# Patient Record
Sex: Female | Born: 1935
Health system: Southern US, Community
[De-identification: ages and names within clinical notes are randomized; demographics above are authoritative.]

## PROBLEM LIST (undated history)

## (undated) DIAGNOSIS — I10 Essential (primary) hypertension: Secondary | ICD-10-CM

## (undated) DIAGNOSIS — E119 Type 2 diabetes mellitus without complications: Secondary | ICD-10-CM

## (undated) HISTORY — PX: ORIF FOOT FRACTURE: SHX2123

## (undated) HISTORY — PX: CATARACT EXTRACTION: SUR2

## (undated) HISTORY — PX: ABDOMINAL HYSTERECTOMY: SHX81

---

## 1997-08-04 ENCOUNTER — Emergency Department (HOSPITAL_COMMUNITY): Admission: EM | Admit: 1997-08-04 | Discharge: 1997-08-04 | Payer: Self-pay | Admitting: Emergency Medicine

## 1997-08-12 ENCOUNTER — Emergency Department (HOSPITAL_COMMUNITY): Admission: EM | Admit: 1997-08-12 | Discharge: 1997-08-12 | Payer: Self-pay | Admitting: Emergency Medicine

## 1998-08-21 ENCOUNTER — Ambulatory Visit (HOSPITAL_COMMUNITY): Admission: RE | Admit: 1998-08-21 | Discharge: 1998-08-21 | Payer: Self-pay | Admitting: Gastroenterology

## 1999-02-16 ENCOUNTER — Encounter: Payer: Self-pay | Admitting: Internal Medicine

## 1999-02-16 ENCOUNTER — Encounter: Admission: RE | Admit: 1999-02-16 | Discharge: 1999-02-16 | Payer: Self-pay | Admitting: Internal Medicine

## 2000-07-07 ENCOUNTER — Encounter: Admission: RE | Admit: 2000-07-07 | Discharge: 2000-07-07 | Payer: Self-pay | Admitting: Internal Medicine

## 2000-07-07 ENCOUNTER — Encounter: Payer: Self-pay | Admitting: Internal Medicine

## 2000-11-25 ENCOUNTER — Encounter: Admission: RE | Admit: 2000-11-25 | Discharge: 2001-02-23 | Payer: Self-pay | Admitting: Internal Medicine

## 2001-03-09 ENCOUNTER — Emergency Department (HOSPITAL_COMMUNITY): Admission: EM | Admit: 2001-03-09 | Discharge: 2001-03-10 | Payer: Self-pay | Admitting: Emergency Medicine

## 2001-07-16 ENCOUNTER — Encounter: Admission: RE | Admit: 2001-07-16 | Discharge: 2001-07-16 | Payer: Self-pay | Admitting: Internal Medicine

## 2001-07-16 ENCOUNTER — Encounter: Payer: Self-pay | Admitting: Internal Medicine

## 2001-11-27 ENCOUNTER — Encounter: Admission: RE | Admit: 2001-11-27 | Discharge: 2001-11-27 | Payer: Self-pay | Admitting: Internal Medicine

## 2001-11-27 ENCOUNTER — Encounter: Payer: Self-pay | Admitting: Internal Medicine

## 2002-07-20 ENCOUNTER — Encounter: Payer: Self-pay | Admitting: Internal Medicine

## 2002-07-20 ENCOUNTER — Encounter: Admission: RE | Admit: 2002-07-20 | Discharge: 2002-07-20 | Payer: Self-pay | Admitting: Internal Medicine

## 2003-07-25 ENCOUNTER — Encounter: Admission: RE | Admit: 2003-07-25 | Discharge: 2003-07-25 | Payer: Self-pay | Admitting: Internal Medicine

## 2004-01-13 ENCOUNTER — Ambulatory Visit: Admission: RE | Admit: 2004-01-13 | Discharge: 2004-01-13 | Payer: Self-pay | Admitting: Orthopedic Surgery

## 2004-01-13 ENCOUNTER — Ambulatory Visit (HOSPITAL_BASED_OUTPATIENT_CLINIC_OR_DEPARTMENT_OTHER): Admission: RE | Admit: 2004-01-13 | Discharge: 2004-01-13 | Payer: Self-pay | Admitting: Orthopedic Surgery

## 2004-07-30 ENCOUNTER — Encounter: Admission: RE | Admit: 2004-07-30 | Discharge: 2004-07-30 | Payer: Self-pay | Admitting: Family Medicine

## 2004-08-10 ENCOUNTER — Encounter: Admission: RE | Admit: 2004-08-10 | Discharge: 2004-08-10 | Payer: Self-pay | Admitting: Family Medicine

## 2005-08-14 ENCOUNTER — Emergency Department (HOSPITAL_COMMUNITY): Admission: EM | Admit: 2005-08-14 | Discharge: 2005-08-14 | Payer: Self-pay | Admitting: Emergency Medicine

## 2006-01-28 ENCOUNTER — Encounter: Admission: RE | Admit: 2006-01-28 | Discharge: 2006-01-28 | Payer: Self-pay | Admitting: Internal Medicine

## 2006-03-17 ENCOUNTER — Encounter: Admission: RE | Admit: 2006-03-17 | Discharge: 2006-03-17 | Payer: Self-pay | Admitting: Internal Medicine

## 2006-11-13 ENCOUNTER — Ambulatory Visit (HOSPITAL_COMMUNITY): Admission: RE | Admit: 2006-11-13 | Discharge: 2006-11-13 | Payer: Self-pay | Admitting: Ophthalmology

## 2007-01-30 ENCOUNTER — Encounter: Admission: RE | Admit: 2007-01-30 | Discharge: 2007-01-30 | Payer: Self-pay | Admitting: *Deleted

## 2008-02-01 ENCOUNTER — Encounter: Admission: RE | Admit: 2008-02-01 | Discharge: 2008-02-01 | Payer: Self-pay | Admitting: Family Medicine

## 2008-10-09 ENCOUNTER — Emergency Department (HOSPITAL_COMMUNITY): Admission: EM | Admit: 2008-10-09 | Discharge: 2008-10-09 | Payer: Self-pay | Admitting: Emergency Medicine

## 2009-02-01 ENCOUNTER — Encounter: Admission: RE | Admit: 2009-02-01 | Discharge: 2009-02-01 | Payer: Self-pay | Admitting: Family Medicine

## 2010-02-02 ENCOUNTER — Encounter: Admission: RE | Admit: 2010-02-02 | Discharge: 2010-02-02 | Payer: Self-pay | Admitting: *Deleted

## 2010-05-20 ENCOUNTER — Encounter: Payer: Self-pay | Admitting: Family Medicine

## 2010-08-06 LAB — CBC
HCT: 40.2 % (ref 36.0–46.0)
Hemoglobin: 13.2 g/dL (ref 12.0–15.0)
MCV: 89.1 fL (ref 78.0–100.0)
Platelets: 229 10*3/uL (ref 150–400)
RBC: 4.51 MIL/uL (ref 3.87–5.11)
WBC: 5.8 10*3/uL (ref 4.0–10.5)

## 2010-08-06 LAB — POCT I-STAT, CHEM 8
Hemoglobin: 13.9 g/dL (ref 12.0–15.0)
Potassium: 3.6 mEq/L (ref 3.5–5.1)
Sodium: 141 mEq/L (ref 135–145)
TCO2: 24 mmol/L (ref 0–100)

## 2010-08-06 LAB — DIFFERENTIAL
Basophils Absolute: 0.1 10*3/uL (ref 0.0–0.1)
Eosinophils Absolute: 0.1 10*3/uL (ref 0.0–0.7)
Eosinophils Relative: 2 % (ref 0–5)
Monocytes Absolute: 0.4 10*3/uL (ref 0.1–1.0)
Monocytes Relative: 8 % (ref 3–12)

## 2010-08-06 LAB — POCT CARDIAC MARKERS
CKMB, poc: <1 ng/mL — ABNORMAL LOW (ref 1.0–8.0)
Myoglobin, poc: 63.8 ng/mL (ref 12–200)

## 2010-09-14 NOTE — Op Note (Signed)
NAME:  Elizabeth Cantu, Elizabeth Cantu                           ACCOUNT NO.:  [id number]   MEDICAL RECORD NO.:  [id number]                   PATIENT TYPE:  AMB   LOCATION:  [facility]                                  FACILITY:  [facility]   PHYSICIAN:  Alta Corning, M.D.                DATE OF BIRTH:  March 13, 1936   DATE OF PROCEDURE:  DATE OF DISCHARGE:                                 OPERATIVE REPORT   DATE OF SURGERY:  January 13, 2004.   PREOPERATIVE DIAGNOSIS:  Medial side joint pain with failure of conservative  care.   POSTOPERATIVE DIAGNOSES:  1.  Medial meniscal tear.  2.  Chondromalacia, medial femoral condyle.  3.  Chondromalacia of the patella.   PRINCIPAL PROCEDURE:  1.  Partial posterior horn medial meniscectomy with __________ debridement      of medial femoral condyle.  2.  Debridement of patellofemoral area.   SURGEON:  Alta Corning, MD.   ASSISTANT:  Gary Fleet, PA.   ANESTHESIA:  General.   BRIEF HISTORY:  A 75 year old female with a long history of having a medial  side knee pain.  She was treated conservatively initially with medications  and injection therapy, and she did well initially, but then began having  increased complaints of pain.  Because of this, she was evaluated and felt  to need an operative arthroscopy, and she is brought to the operating room  for this procedure.   PROCEDURE:  The patient was brought to the operating room and after adequate  anesthesia was obtained with general anesthetic, the patient was placed on  the operating table, and the left leg was prepped and draped in the usual  sterile fashion.  Following this, attention was turned to the medial aspect  of the left knee, which showed grade III and some grade IV changes on the  medial femoral condyle.  This was debrided back to a smooth and stable rim.  The medial meniscus had a complex tear from the mid-body around to the  posterior horn.  This was probed and the portions of the torn  meniscus,  which were flipped out of position, were debrided with a suction shaver and  the straight biting forceps.  Once the remaining meniscal rim was contoured  down, attention was turned up into the patellofemoral joint where a  patellofemoral chondroplasty was performed.  The medial femoral condyle was  likewise debrided again at this point.  The lateral compartment was within  normal limits.  The anterior cruciate was  within normal limits.  At this point, the knee was copiously irrigated and  suctioned dry.  The arthroscopic portals were closed with a bandage, a  sterile compressive dressing was applied, and the patient taken to the  recovery room where she was noted to be in satisfactory condition.  Estimated blood loss for the procedure was none.      JLG/MEDQ  D:  01/13/2004  T:  01/15/2004  Job:  [id number]

## 2011-01-01 ENCOUNTER — Other Ambulatory Visit: Payer: Self-pay | Admitting: Family Medicine

## 2011-01-01 DIAGNOSIS — Z1231 Encounter for screening mammogram for malignant neoplasm of breast: Secondary | ICD-10-CM

## 2011-02-04 ENCOUNTER — Ambulatory Visit
Admission: RE | Admit: 2011-02-04 | Discharge: 2011-02-04 | Disposition: A | Discharge status: Home / Self Care | Payer: PRIVATE HEALTH INSURANCE | Source: Ambulatory Visit | Attending: Family Medicine | Admitting: Family Medicine

## 2011-02-04 DIAGNOSIS — Z1231 Encounter for screening mammogram for malignant neoplasm of breast: Secondary | ICD-10-CM

## 2012-01-03 ENCOUNTER — Other Ambulatory Visit: Payer: Self-pay | Admitting: Family Medicine

## 2012-01-03 DIAGNOSIS — Z1231 Encounter for screening mammogram for malignant neoplasm of breast: Secondary | ICD-10-CM

## 2012-02-07 ENCOUNTER — Ambulatory Visit
Admission: RE | Admit: 2012-02-07 | Discharge: 2012-02-07 | Disposition: A | Discharge status: Home / Self Care | Payer: Medicare Other | Source: Ambulatory Visit | Attending: Family Medicine | Admitting: Family Medicine

## 2012-02-07 DIAGNOSIS — Z1231 Encounter for screening mammogram for malignant neoplasm of breast: Secondary | ICD-10-CM

## 2012-11-06 ENCOUNTER — Other Ambulatory Visit: Payer: Self-pay

## 2012-11-06 DIAGNOSIS — Z1231 Encounter for screening mammogram for malignant neoplasm of breast: Secondary | ICD-10-CM

## 2012-12-08 ENCOUNTER — Other Ambulatory Visit: Payer: Self-pay | Admitting: Family Medicine

## 2012-12-08 DIAGNOSIS — M949 Disorder of cartilage, unspecified: Secondary | ICD-10-CM

## 2013-02-08 ENCOUNTER — Ambulatory Visit
Admission: RE | Admit: 2013-02-08 | Discharge: 2013-02-08 | Disposition: A | Discharge status: Home / Self Care | Payer: Medicare Other | Source: Ambulatory Visit

## 2013-02-08 ENCOUNTER — Ambulatory Visit
Admission: RE | Admit: 2013-02-08 | Discharge: 2013-02-08 | Disposition: A | Discharge status: Home / Self Care | Payer: Medicare Other | Source: Ambulatory Visit | Attending: Family Medicine | Admitting: Family Medicine

## 2013-02-08 DIAGNOSIS — Z1231 Encounter for screening mammogram for malignant neoplasm of breast: Secondary | ICD-10-CM

## 2013-02-08 DIAGNOSIS — M899 Disorder of bone, unspecified: Secondary | ICD-10-CM

## 2014-01-04 ENCOUNTER — Other Ambulatory Visit: Payer: Self-pay

## 2014-01-04 DIAGNOSIS — Z1231 Encounter for screening mammogram for malignant neoplasm of breast: Secondary | ICD-10-CM

## 2014-01-26 ENCOUNTER — Other Ambulatory Visit (HOSPITAL_COMMUNITY): Payer: Self-pay | Admitting: Gastroenterology

## 2014-01-26 DIAGNOSIS — R6881 Early satiety: Secondary | ICD-10-CM

## 2014-02-09 ENCOUNTER — Ambulatory Visit
Admission: RE | Admit: 2014-02-09 | Discharge: 2014-02-09 | Disposition: A | Discharge status: Home / Self Care | Payer: Medicare Other | Source: Ambulatory Visit

## 2014-02-09 DIAGNOSIS — Z1231 Encounter for screening mammogram for malignant neoplasm of breast: Secondary | ICD-10-CM

## 2014-02-10 ENCOUNTER — Ambulatory Visit (HOSPITAL_COMMUNITY)
Admission: RE | Admit: 2014-02-10 | Discharge: 2014-02-10 | Disposition: A | Discharge status: Home / Self Care | Payer: Medicare Other | Source: Ambulatory Visit | Attending: Gastroenterology | Admitting: Gastroenterology

## 2014-02-10 DIAGNOSIS — R6881 Early satiety: Secondary | ICD-10-CM | POA: Insufficient documentation

## 2014-02-10 DIAGNOSIS — R634 Abnormal weight loss: Secondary | ICD-10-CM | POA: Insufficient documentation

## 2014-02-10 LAB — GLUCOSE, CAPILLARY: GLUCOSE-CAPILLARY: 122 mg/dL — AB (ref 70–99)

## 2014-02-10 MED ORDER — TECHNETIUM TC 99M SULFUR COLLOID
2.2000 | Freq: Once | INTRAVENOUS | Status: AC | PRN
  Administered 2014-02-10: 2.2 via INTRAVENOUS

## 2014-05-07 ENCOUNTER — Encounter (HOSPITAL_COMMUNITY): Payer: Self-pay

## 2014-05-07 ENCOUNTER — Emergency Department (HOSPITAL_COMMUNITY)
Admission: EM | Admit: 2014-05-07 | Discharge: 2014-05-07 | Disposition: A | Discharge status: Home / Self Care | Payer: Medicare Other | Attending: Emergency Medicine | Admitting: Emergency Medicine

## 2014-05-07 ENCOUNTER — Emergency Department (HOSPITAL_COMMUNITY): Payer: Medicare Other

## 2014-05-07 DIAGNOSIS — I10 Essential (primary) hypertension: Secondary | ICD-10-CM | POA: Insufficient documentation

## 2014-05-07 DIAGNOSIS — Y9389 Activity, other specified: Secondary | ICD-10-CM | POA: Diagnosis not present

## 2014-05-07 DIAGNOSIS — Y998 Other external cause status: Secondary | ICD-10-CM | POA: Insufficient documentation

## 2014-05-07 DIAGNOSIS — S60112A Contusion of left thumb with damage to nail, initial encounter: Secondary | ICD-10-CM | POA: Diagnosis not present

## 2014-05-07 DIAGNOSIS — W231XXA Caught, crushed, jammed, or pinched between stationary objects, initial encounter: Secondary | ICD-10-CM | POA: Insufficient documentation

## 2014-05-07 DIAGNOSIS — Z7982 Long term (current) use of aspirin: Secondary | ICD-10-CM | POA: Diagnosis not present

## 2014-05-07 DIAGNOSIS — S60932A Unspecified superficial injury of left thumb, initial encounter: Secondary | ICD-10-CM | POA: Diagnosis present

## 2014-05-07 DIAGNOSIS — S6010XA Contusion of unspecified finger with damage to nail, initial encounter: Secondary | ICD-10-CM

## 2014-05-07 DIAGNOSIS — Y9289 Other specified places as the place of occurrence of the external cause: Secondary | ICD-10-CM | POA: Diagnosis not present

## 2014-05-07 DIAGNOSIS — E119 Type 2 diabetes mellitus without complications: Secondary | ICD-10-CM | POA: Insufficient documentation

## 2014-05-07 DIAGNOSIS — Z79899 Other long term (current) drug therapy: Secondary | ICD-10-CM | POA: Diagnosis not present

## 2014-05-07 HISTORY — DX: Essential (primary) hypertension: I10

## 2014-05-07 HISTORY — DX: Type 2 diabetes mellitus without complications: E11.9

## 2014-05-07 NOTE — ED Notes (Signed)
She states about three hours ago she shut her left thumb in car door.  She has pain in same and a proximal subungual hematoma with pain there.  She is in no distress.

## 2014-05-07 NOTE — ED Provider Notes (Signed)
CSN: [id number]     Arrival date & time 05/07/14  1604 History   First MD Initiated Contact with Patient 05/07/14 1751     Chief Complaint  Patient presents with  . Finger Injury     (Consider location/radiation/quality/duration/timing/severity/associated sxs/prior Treatment) HPI  Patient presents after sustaining an injury to her left thumb. She slammed her thumb in the car door. Since that time there's been pain focally in the area.  Pain is severe, nonradiating.  No loss of function, nor other injuries. She has not taken any medication for pain relief thus far.   Past Medical History  Diagnosis Date  . Diabetes mellitus without complication   . Hypertension    Past Surgical History  Procedure Laterality Date  . Orif foot fracture Right   . Abdominal hysterectomy     No family history on file. History  Substance Use Topics  . Smoking status: Never Smoker   . Smokeless tobacco: Current User  . Alcohol Use: No   OB History    No data available     Review of Systems  Constitutional: Negative for fever.  Musculoskeletal:       HPI  Skin: Positive for wound.  Allergic/Immunologic: Negative for immunocompromised state.  Neurological: Negative for weakness and numbness.      Allergies  Review of patient's allergies indicates no known allergies.  Home Medications   Prior to Admission medications   Medication Sig Start Date End Date Taking? Authorizing Provider  aspirin 81 MG tablet Take 81 mg by mouth daily.   Yes Historical Provider, MD  calcium carbonate (OS-CAL) 600 MG TABS tablet Take 600 mg by mouth daily.   Yes Historical Provider, MD  Cyanocobalamin (VITAMIN B-12 PO) Take 2,500 mcg by mouth daily.   Yes Historical Provider, MD  diltiazem (TIAZAC) 300 MG 24 hr capsule Take 300 mg by mouth daily.   Yes Historical Provider, MD  ferrous sulfate 325 (65 FE) MG tablet Take 325 mg by mouth daily with breakfast.   Yes Historical Provider, MD  hydrochlorothiazide  (HYDRODIURIL) 25 MG tablet Take 25 mg by mouth daily.   Yes Historical Provider, MD  losartan (COZAAR) 50 MG tablet Take 50 mg by mouth daily.   Yes Historical Provider, MD  lovastatin (MEVACOR) 40 MG tablet Take 40 mg by mouth daily.   Yes Historical Provider, MD  metFORMIN (GLUCOPHAGE) 1000 MG tablet Take 1,000 mg by mouth 2 (two) times daily with a meal.   Yes Historical Provider, MD  pantoprazole (PROTONIX) 40 MG tablet Take 40 mg by mouth daily.   Yes Historical Provider, MD  PARoxetine (PAXIL) 10 MG tablet Take 10 mg by mouth daily.   Yes Historical Provider, MD  potassium chloride SA (K-DUR,KLOR-CON) 20 MEQ tablet Take 20 mEq by mouth 2 (two) times daily.   Yes Historical Provider, MD   BP 130/88 mmHg  Pulse 84  Temp(Src) 98.2 F (36.8 C) (Oral)  Resp 16  SpO2 97% Physical Exam  Constitutional: She is oriented to person, place, and time. She appears well-developed and well-nourished. No distress.  HENT:  Head: Normocephalic and atraumatic.  Eyes: Conjunctivae and EOM are normal.  Cardiovascular: Normal rate, regular rhythm and intact distal pulses.   Pulmonary/Chest: Effort normal. No stridor. No respiratory distress.  Musculoskeletal: She exhibits no edema.       Arms: Neurological: She is alert and oriented to person, place, and time. No cranial nerve deficit.  Skin: Skin is warm and dry.  Psychiatric:  She has a normal mood and affect.  Nursing note and vitals reviewed.   ED Course  Drain subungal hematoma Date/Time: 05/07/2014 6:47 PM Performed by: Carmin Muskrat Authorized by: Carmin Muskrat Consent: The procedure was performed in an emergent situation. Verbal consent obtained. Risks and benefits: risks, benefits and alternatives were discussed Consent given by: patient Patient understanding: patient states understanding of the procedure being performed Patient consent: the patient's understanding of the procedure matches consent given Procedure consent: procedure  consent matches procedure scheduled Relevant documents: relevant documents present and verified Test results: test results available and properly labeled Site marked: the operative site was marked Imaging studies: imaging studies available Required items: required blood products, implants, devices, and special equipment available Patient identity confirmed: verbally with patient Preparation: Patient was prepped and draped in the usual sterile fashion. Local anesthesia used: no Patient sedated: no Patient tolerance: Patient tolerated the procedure well with no immediate complications Comments: Following irrigation (H2O) and sterilization of the area, two independent holes were made via high temperature trephination. Blood was produced through each opening. Procedure well tolerated.   (including critical care time) Labs Review Labs Reviewed - No data to display  Imaging Review Dg Finger Thumb Left  05/07/2014   CLINICAL DATA:  Pain, bruising, discoloration, shut finger in door today  EXAM: LEFT THUMB 2+V  COMPARISON:  None.  FINDINGS: There is no evidence of fracture or dislocation. 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 Soft tissues are unremarkable  IMPRESSION: No acute osseous injury of the left thumb.   Electronically Signed   By: Kathreen Devoid   On: 05/07/2014 17:19      MDM   Final diagnoses:  Subungual hematoma of digit of hand, initial encounter    Patient presents after sustaining an injury to her left thumb. Patient has no evidence for fracture, nor neurologic compromise.  The patient had successful trephination of the thumb with production of blood through each opening. Patient felt better, was discharged in stable condition with and follow-up as needed.    Carmin Muskrat, MD 05/07/14 [phone]

## 2014-05-07 NOTE — Discharge Instructions (Signed)
Subungual Hematoma °A subungual hematoma is a pocket of blood that collects under the fingernail or toenail. The pressure created by the blood under the nail can cause pain. °CAUSES  °A subungual hematoma occurs when an injury to the finger or toe causes a blood vessel beneath the nail to break. The injury can occur from a direct blow such as slamming a finger in a door. It can also occur from a repeated injury such as pressure on the foot in a shoe while running. A subungual hematoma is sometimes called runner's toe or tennis toe. °SYMPTOMS  °· Blue or dark blue skin under the nail. °· Pain or throbbing in the injured area. °DIAGNOSIS  °Your caregiver can determine whether you have a subungual hematoma based on your history and a physical exam. If your caregiver thinks you might have a broken (fractured) bone, X-rays may be taken. °TREATMENT  °Hematomas usually go away on their own over time. Your caregiver may make a hole in the nail to drain the blood. Draining the blood is painless and usually provides significant relief from pain and throbbing. The nail usually grows back normally after this procedure. In some cases, the nail may need to be removed. This is done if there is a cut under the nail that requires stitches (sutures). °HOME CARE INSTRUCTIONS  °· Put ice on the injured area. °¨ Put ice in a plastic bag. °¨ Place a towel between your skin and the bag. °¨ Leave the ice on for 15-20 minutes, 03-04 times a day for the first 1 to 2 days. °· Elevate the injured area to help decrease pain and swelling. °· If you were given a bandage, wear it for as long as directed by your caregiver. °· If part of your nail falls off, trim the remaining nail gently. This prevents the nail from catching on something and causing further injury. °· Only take over-the-counter or prescription medicines for pain, discomfort, or fever as directed by your caregiver. °SEEK IMMEDIATE MEDICAL CARE IF:  °· You have redness or swelling  around the nail. °· You have yellowish-white fluid (pus) coming from the nail. °· Your pain is not controlled with medicine. °· You have a fever. °MAKE SURE YOU: °· Understand these instructions. °· Will watch your condition. °· Will get help right away if you are not doing well or get worse. °Document Released: 04/12/2000 Document Revised: 07/08/2011 Document Reviewed: 04/03/2011 °ExitCare® Patient Information ©2015 [employer]. This information is not intended to replace advice given to you by your health care provider. Make sure you discuss any questions you have with your health care provider. ° °

## 2014-06-08 ENCOUNTER — Other Ambulatory Visit: Payer: Self-pay | Admitting: Family Medicine

## 2014-06-08 DIAGNOSIS — E785 Hyperlipidemia, unspecified: Secondary | ICD-10-CM

## 2014-06-08 DIAGNOSIS — R42 Dizziness and giddiness: Secondary | ICD-10-CM

## 2014-06-08 DIAGNOSIS — R200 Anesthesia of skin: Secondary | ICD-10-CM

## 2014-06-15 ENCOUNTER — Ambulatory Visit
Admission: RE | Admit: 2014-06-15 | Discharge: 2014-06-15 | Disposition: A | Discharge status: Home / Self Care | Payer: Medicare Other | Source: Ambulatory Visit | Attending: Family Medicine | Admitting: Family Medicine

## 2014-06-15 DIAGNOSIS — E785 Hyperlipidemia, unspecified: Secondary | ICD-10-CM

## 2014-06-15 DIAGNOSIS — R42 Dizziness and giddiness: Secondary | ICD-10-CM

## 2014-06-15 DIAGNOSIS — R200 Anesthesia of skin: Secondary | ICD-10-CM

## 2015-01-13 ENCOUNTER — Other Ambulatory Visit: Payer: Self-pay

## 2015-01-13 DIAGNOSIS — Z1231 Encounter for screening mammogram for malignant neoplasm of breast: Secondary | ICD-10-CM

## 2015-02-15 ENCOUNTER — Ambulatory Visit
Admission: RE | Admit: 2015-02-15 | Discharge: 2015-02-15 | Disposition: A | Discharge status: Home / Self Care | Payer: Medicare Other | Source: Ambulatory Visit

## 2015-02-15 DIAGNOSIS — Z1231 Encounter for screening mammogram for malignant neoplasm of breast: Secondary | ICD-10-CM

## 2015-08-29 ENCOUNTER — Encounter: Payer: Self-pay | Admitting: Podiatry

## 2015-08-29 ENCOUNTER — Ambulatory Visit (INDEPENDENT_AMBULATORY_CARE_PROVIDER_SITE_OTHER): Payer: Medicare Other | Admitting: Podiatry

## 2015-08-29 DIAGNOSIS — Z794 Long term (current) use of insulin: Secondary | ICD-10-CM

## 2015-08-29 DIAGNOSIS — B351 Tinea unguium: Secondary | ICD-10-CM | POA: Diagnosis not present

## 2015-08-29 DIAGNOSIS — M79676 Pain in unspecified toe(s): Secondary | ICD-10-CM

## 2015-08-29 DIAGNOSIS — E1142 Type 2 diabetes mellitus with diabetic polyneuropathy: Secondary | ICD-10-CM | POA: Diagnosis not present

## 2015-08-29 NOTE — Progress Notes (Signed)
   Subjective:    Patient ID: Elizabeth Cantu, female    DOB: 06-Mar-1936, 80 y.o.   MRN: [id number]  HPI: She presents today with a chief complaint of painful elongated nails and concerns of thickening of the nails as well as dark stripes along the length of the nail.  Review of Systems  All other systems reviewed and are negative.      Objective:   Physical Exam: Vital signs are stable she is alert and oriented 3 pulses are palpable. Neurologic sensorium is intact. Deep tendon reflexes are intact. Muscle strength is normal bilateral. Orthopedic evaluation demonstrates all joints distal to the ankle for range of motion without crepitation. Cutaneous evaluation demonstrates supple well-hydrated cutis. Toenails are thick dystrophic clinically mycotic but also demonstrate melanocytic distribution this is consistent with melanonychia associated with her skin color. These do not demonstrate any type of neoplasm.          Assessment & Plan:  Assessment: Pain secondary to onychomycosis.  Plan: Debridement of toenails 1 through 5 bilateral.

## 2016-01-23 ENCOUNTER — Other Ambulatory Visit: Payer: Self-pay | Admitting: Family Medicine

## 2016-01-23 DIAGNOSIS — Z1231 Encounter for screening mammogram for malignant neoplasm of breast: Secondary | ICD-10-CM

## 2016-01-23 DIAGNOSIS — E2839 Other primary ovarian failure: Secondary | ICD-10-CM

## 2016-02-19 ENCOUNTER — Ambulatory Visit
Admission: RE | Admit: 2016-02-19 | Discharge: 2016-02-19 | Disposition: A | Discharge status: Home / Self Care | Payer: Medicare Other | Source: Ambulatory Visit | Attending: Family Medicine | Admitting: Family Medicine

## 2016-02-19 DIAGNOSIS — E2839 Other primary ovarian failure: Secondary | ICD-10-CM

## 2016-02-19 DIAGNOSIS — Z1231 Encounter for screening mammogram for malignant neoplasm of breast: Secondary | ICD-10-CM

## 2017-02-10 ENCOUNTER — Other Ambulatory Visit: Payer: Self-pay | Admitting: Family Medicine

## 2017-02-10 DIAGNOSIS — Z1231 Encounter for screening mammogram for malignant neoplasm of breast: Secondary | ICD-10-CM

## 2017-02-26 ENCOUNTER — Ambulatory Visit
Admission: RE | Admit: 2017-02-26 | Discharge: 2017-02-26 | Disposition: A | Discharge status: Home / Self Care | Payer: Medicare Other | Source: Ambulatory Visit | Attending: Family Medicine | Admitting: Family Medicine

## 2017-02-26 DIAGNOSIS — Z1231 Encounter for screening mammogram for malignant neoplasm of breast: Secondary | ICD-10-CM

## 2018-01-16 ENCOUNTER — Other Ambulatory Visit: Payer: Self-pay | Admitting: Family Medicine

## 2018-01-16 DIAGNOSIS — Z1231 Encounter for screening mammogram for malignant neoplasm of breast: Secondary | ICD-10-CM

## 2018-03-02 ENCOUNTER — Ambulatory Visit
Admission: RE | Admit: 2018-03-02 | Discharge: 2018-03-02 | Disposition: A | Discharge status: Home / Self Care | Payer: Medicare Other | Source: Ambulatory Visit | Attending: Family Medicine | Admitting: Family Medicine

## 2018-03-02 DIAGNOSIS — Z1231 Encounter for screening mammogram for malignant neoplasm of breast: Secondary | ICD-10-CM

## 2019-02-02 ENCOUNTER — Other Ambulatory Visit: Payer: Self-pay | Admitting: Family Medicine

## 2019-02-02 DIAGNOSIS — Z1231 Encounter for screening mammogram for malignant neoplasm of breast: Secondary | ICD-10-CM

## 2019-03-18 ENCOUNTER — Ambulatory Visit: Payer: Medicare Other

## 2019-04-20 ENCOUNTER — Other Ambulatory Visit: Payer: Self-pay

## 2019-04-20 ENCOUNTER — Ambulatory Visit
Admission: RE | Admit: 2019-04-20 | Discharge: 2019-04-20 | Disposition: A | Discharge status: Home / Self Care | Payer: Medicare Other | Source: Ambulatory Visit | Attending: Family Medicine | Admitting: Family Medicine

## 2019-04-20 DIAGNOSIS — Z1231 Encounter for screening mammogram for malignant neoplasm of breast: Secondary | ICD-10-CM

## 2019-12-14 DIAGNOSIS — I129 Hypertensive chronic kidney disease with stage 1 through stage 4 chronic kidney disease, or unspecified chronic kidney disease: Secondary | ICD-10-CM | POA: Diagnosis not present

## 2019-12-14 DIAGNOSIS — E785 Hyperlipidemia, unspecified: Secondary | ICD-10-CM | POA: Diagnosis not present

## 2019-12-14 DIAGNOSIS — E1142 Type 2 diabetes mellitus with diabetic polyneuropathy: Secondary | ICD-10-CM | POA: Diagnosis not present

## 2019-12-14 DIAGNOSIS — E1121 Type 2 diabetes mellitus with diabetic nephropathy: Secondary | ICD-10-CM | POA: Diagnosis not present

## 2019-12-14 DIAGNOSIS — N183 Chronic kidney disease, stage 3 unspecified: Secondary | ICD-10-CM | POA: Diagnosis not present

## 2019-12-14 DIAGNOSIS — H6123 Impacted cerumen, bilateral: Secondary | ICD-10-CM | POA: Diagnosis not present

## 2019-12-21 ENCOUNTER — Other Ambulatory Visit: Payer: Self-pay | Admitting: Family Medicine

## 2019-12-21 DIAGNOSIS — Z1231 Encounter for screening mammogram for malignant neoplasm of breast: Secondary | ICD-10-CM

## 2019-12-22 ENCOUNTER — Other Ambulatory Visit: Payer: Self-pay | Admitting: Family Medicine

## 2019-12-22 DIAGNOSIS — N6310 Unspecified lump in the right breast, unspecified quadrant: Secondary | ICD-10-CM

## 2020-01-10 ENCOUNTER — Ambulatory Visit
Admission: RE | Admit: 2020-01-10 | Discharge: 2020-01-10 | Disposition: A | Discharge status: Home / Self Care | Payer: Medicare Other | Source: Ambulatory Visit | Attending: Family Medicine | Admitting: Family Medicine

## 2020-01-10 ENCOUNTER — Ambulatory Visit
Admission: RE | Admit: 2020-01-10 | Discharge: 2020-01-10 | Disposition: A | Discharge status: Home / Self Care | Payer: Medicare PPO | Source: Ambulatory Visit | Attending: Family Medicine | Admitting: Family Medicine

## 2020-01-10 ENCOUNTER — Other Ambulatory Visit: Payer: Self-pay

## 2020-01-10 DIAGNOSIS — N6489 Other specified disorders of breast: Secondary | ICD-10-CM | POA: Diagnosis not present

## 2020-01-10 DIAGNOSIS — N6310 Unspecified lump in the right breast, unspecified quadrant: Secondary | ICD-10-CM

## 2020-01-10 DIAGNOSIS — R922 Inconclusive mammogram: Secondary | ICD-10-CM | POA: Diagnosis not present

## 2020-03-04 DIAGNOSIS — Z23 Encounter for immunization: Secondary | ICD-10-CM | POA: Diagnosis not present

## 2020-03-07 ENCOUNTER — Other Ambulatory Visit: Payer: Self-pay

## 2020-03-07 ENCOUNTER — Ambulatory Visit (INDEPENDENT_AMBULATORY_CARE_PROVIDER_SITE_OTHER): Payer: Medicare PPO | Admitting: Ophthalmology

## 2020-03-07 ENCOUNTER — Encounter (INDEPENDENT_AMBULATORY_CARE_PROVIDER_SITE_OTHER): Payer: Self-pay | Admitting: Ophthalmology

## 2020-03-07 DIAGNOSIS — E113292 Type 2 diabetes mellitus with mild nonproliferative diabetic retinopathy without macular edema, left eye: Secondary | ICD-10-CM

## 2020-03-07 DIAGNOSIS — H35371 Puckering of macula, right eye: Secondary | ICD-10-CM | POA: Diagnosis not present

## 2020-03-07 DIAGNOSIS — Z794 Long term (current) use of insulin: Secondary | ICD-10-CM

## 2020-03-07 DIAGNOSIS — E113293 Type 2 diabetes mellitus with mild nonproliferative diabetic retinopathy without macular edema, bilateral: Secondary | ICD-10-CM | POA: Diagnosis not present

## 2020-03-07 DIAGNOSIS — E113291 Type 2 diabetes mellitus with mild nonproliferative diabetic retinopathy without macular edema, right eye: Secondary | ICD-10-CM

## 2020-03-07 DIAGNOSIS — H43813 Vitreous degeneration, bilateral: Secondary | ICD-10-CM

## 2020-03-07 DIAGNOSIS — Z961 Presence of intraocular lens: Secondary | ICD-10-CM

## 2020-03-07 NOTE — Assessment & Plan Note (Signed)
No management required

## 2020-03-07 NOTE — Assessment & Plan Note (Signed)

## 2020-03-07 NOTE — Assessment & Plan Note (Signed)
The nature of mild nonproliferative diabetic retinopathy was discussed with the patient. Emphasis was placed on tight glucose, blood pressure, and serum lipid control. Avoidance of smoking was emphasized. Maintenance of normal body weight was emphasized. Appropriate follow up dilated exam is 1 year. 

## 2020-03-07 NOTE — Progress Notes (Signed)
03/07/2020     CHIEF COMPLAINT Patient presents for Retina Follow Up   HISTORY OF PRESENT ILLNESS: Elizabeth Cantu is a 84 y.o. female who presents to the clinic today for:   HPI    Retina Follow Up    Patient presents with  Diabetic Retinopathy.  In both eyes.  Severity is moderate.  Duration of 1 year.  Since onset it is stable.  I, the attending physician,  performed the HPI with the patient and updated documentation appropriately.          Comments    1 Year NPDR f\u OU. OCT  Pt states OS becomes red and painful occasionally. Pt will use gtts and it gets better. Denies any changes in vision. BGL: 198 A1C: unknown       Last edited by Tilda Franco on 03/07/2020 10:07 AM. (History)      Referring physician: Kelton Pillar, MD [address],  [place] [address]  HISTORICAL INFORMATION:   Selected notes from the [facility]: No current outpatient medications on file. (Ophthalmic Drugs)   No current facility-administered medications for this visit. (Ophthalmic Drugs)   Current Outpatient Medications (Other)  Medication Sig  . aspirin 81 MG tablet Take 81 mg by mouth daily.  . calcium carbonate (OS-CAL) 600 MG TABS tablet Take 600 mg by mouth daily.  . Cyanocobalamin (VITAMIN B-12 PO) Take 2,500 mcg by mouth daily.  Marland Kitchen diltiazem (TIAZAC) 300 MG 24 hr capsule Take 300 mg by mouth daily.  . ferrous sulfate 325 (65 FE) MG tablet Take 325 mg by mouth daily with breakfast.  . hydrochlorothiazide (HYDRODIURIL) 25 MG tablet Take 25 mg by mouth daily.  Marland Kitchen HYDROcodone-acetaminophen (NORCO/VICODIN) 5-325 MG tablet TK 1 T PO BID PRN P  . losartan (COZAAR) 50 MG tablet Take 50 mg by mouth daily.  Marland Kitchen lovastatin (MEVACOR) 40 MG tablet Take 40 mg by mouth daily.  Marland Kitchen LYRICA 50 MG capsule TK 1 C  PO QHS  . metFORMIN (GLUCOPHAGE) 1000 MG tablet Take 1,000 mg by mouth 2 (two) times daily with a meal.  . pantoprazole (PROTONIX)  40 MG tablet Take 40 mg by mouth daily.  Marland Kitchen PARoxetine (PAXIL) 10 MG tablet Take 10 mg by mouth daily.  . potassium chloride SA (K-DUR,KLOR-CON) 20 MEQ tablet Take 20 mEq by mouth 2 (two) times daily.   No current facility-administered medications for this visit. (Other)      REVIEW OF SYSTEMS: ROS    Positive for: Endocrine   Last edited by Tilda Franco on 03/07/2020 10:07 AM. (History)       ALLERGIES Allergies  Allergen Reactions  . Penicillins     PAST MEDICAL HISTORY Past Medical History:  Diagnosis Date  . Diabetes mellitus without complication ([place])   . Hypertension    Past Surgical History:  Procedure Laterality Date  . ABDOMINAL HYSTERECTOMY    . CATARACT EXTRACTION Bilateral   . ORIF FOOT FRACTURE Right     FAMILY HISTORY History reviewed. No pertinent family history.  SOCIAL HISTORY Social History   Tobacco Use  . Smoking status: Never Smoker  . Smokeless tobacco: Current User  Substance Use Topics  . Alcohol use: No  . Drug use: No         OPHTHALMIC EXAM:  Base Eye Exam    Visual Acuity (Snellen - Linear)      Right Left  Dist [employer] 20/40 -2 20/40 +   Dist ph [place] 20/30 20/25 +       Tonometry (Tonopen, 10:15 AM)      Right Left   Pressure 22 24       Tonometry #2 (Tonopen, 10:15 AM)      Right Left   Pressure 19 24       Pupils      Pupils Dark Light Shape React APD   Right PERRL 1 1 Round Minimal None   Left PERRL 1 1 Round Minimal None       Visual Fields (Counting fingers)      Left Right    Full Full       Neuro/Psych    Oriented x3: Yes   Mood/Affect: Normal       Dilation    Both eyes: 1.0% Mydriacyl, 2.5% Phenylephrine @ 10:15 AM        Slit Lamp and Fundus Exam    External Exam      Right Left   External Normal Normal       Slit Lamp Exam      Right Left   Lids/Lashes Normal Normal   Conjunctiva/Sclera White and quiet White and quiet   Cornea Clear Clear   Anterior Chamber Deep and quiet Deep  and quiet   Iris Round and reactive Round and reactive   Lens Centered posterior chamber intraocular lens Centered posterior chamber intraocular lens   Anterior Vitreous Normal Normal       Fundus Exam      Right Left   Posterior Vitreous Posterior vitreous detachment Posterior vitreous detachment   Disc Normal Normal   C/D Ratio 0.0 0.0   Macula Normal Normal   Vessels NPDR- Mild NPDR- Mild   Periphery Normal Normal          IMAGING AND PROCEDURES  Imaging and Procedures for 03/07/20  OCT, Retina - OU - Both Eyes       Right Eye Quality was good. Scan locations included subfoveal. Central Foveal Thickness: 291. Progression has been stable.   Left Eye Quality was good. Scan locations included subfoveal. Central Foveal Thickness: 289. Progression has been stable.   Notes Bilateral PVD                ASSESSMENT/PLAN:  Nonproliferative diabetic retinopathy of both eyes (HCC) The nature of mild nonproliferative diabetic retinopathy was discussed with the patient. Emphasis was placed on tight glucose, blood pressure, and serum lipid control. Avoidance of smoking was emphasized. Maintenance of normal body weight was emphasized. Appropriate follow up dilated exam is 1 year.  Posterior vitreous detachment of both eyes   The nature of posterior vitreous detachment was discussed with the patient as well as its physiology, its age prevalence, and its possible implication regarding retinal breaks and detachment.  An informational brochure was given to the patient.  All the patient's questions were answered.  The patient was asked to return if new or different flashes or floaters develops.   Patient was instructed to contact office immediately if any changes were noticed. I explained to the patient that vitreous inside the eye is similar to jello inside a bowl. As the jello melts it can start to pull away from the bowl, similarly the vitreous throughout our lives can begin to  pull away from the retina. That process is called a posterior vitreous detachment. In some cases, the vitreous can tug hard enough on the retina to form a  retinal tear. I discussed with the patient the signs and symptoms of a retinal detachment.  Do not rub the eye.  Pseudophakia of both eyes No management required      ICD-10-CM   1. Controlled type 2 diabetes mellitus with mild nonproliferative retinopathy of left eye, with long-term current use of insulin, macular edema presence unspecified ([place])  J24.2683    Z79.4   2. Controlled type 2 diabetes mellitus with right eye affected by mild nonproliferative retinopathy without macular edema, without long-term current use of insulin ([place])  E11.3291   3. Right epiretinal membrane  H35.371 OCT, Retina - OU - Both Eyes  4. Posterior vitreous detachment of both eyes  H43.813   5. Nonproliferative diabetic retinopathy of both eyes ([place])  M19.6222   6. Pseudophakia of both eyes  Z96.1     1.  2.  3.  Ophthalmic Meds Ordered this visit:  No orders of the defined types were placed in this encounter.      Return in about 1 year (around 03/07/2021) for DILATE OU, OCT.  There are no Patient Instructions on file for this visit.   Explained the diagnoses, plan, and follow up with the patient and they expressed understanding.  Patient expressed understanding of the importance of proper follow up care.   Clent Demark Eulia Hatcher M.D. Diseases & Surgery of the Retina and Vitreous Retina & Diabetic [place] 03/07/20     Abbreviations: M myopia (nearsighted); A astigmatism; H hyperopia (farsighted); P presbyopia; Mrx spectacle prescription;  CTL contact lenses; OD right eye; OS left eye; OU both eyes  XT exotropia; ET esotropia; PEK punctate epithelial keratitis; PEE punctate epithelial erosions; DES dry eye syndrome; MGD meibomian gland dysfunction; ATs artificial tears; PFAT's preservative free artificial tears; [place] nuclear sclerotic cataract; PSC  posterior subcapsular cataract; ERM epi-retinal membrane; PVD posterior vitreous detachment; RD retinal detachment; DM diabetes mellitus; DR diabetic retinopathy; NPDR non-proliferative diabetic retinopathy; PDR proliferative diabetic retinopathy; CSME clinically significant macular edema; DME diabetic macular edema; dbh dot blot hemorrhages; CWS cotton wool spot; POAG primary open angle glaucoma; C/D cup-to-disc ratio; HVF humphrey visual field; GVF goldmann visual field; OCT optical coherence tomography; IOP intraocular pressure; BRVO Branch retinal vein occlusion; CRVO central retinal vein occlusion; CRAO central retinal artery occlusion; BRAO branch retinal artery occlusion; RT retinal tear; SB scleral buckle; PPV pars plana vitrectomy; VH Vitreous hemorrhage; PRP panretinal laser photocoagulation; IVK intravitreal kenalog; VMT vitreomacular traction; MH Macular hole;  NVD neovascularization of the disc; NVE neovascularization elsewhere; AREDS age related eye disease study; ARMD age related macular degeneration; POAG primary open angle glaucoma; EBMD epithelial/anterior basement membrane dystrophy; ACIOL anterior chamber intraocular lens; IOL intraocular lens; PCIOL posterior chamber intraocular lens; Phaco/IOL phacoemulsification with intraocular lens placement; [place] photorefractive keratectomy; LASIK laser assisted in situ keratomileusis; HTN hypertension; DM diabetes mellitus; COPD chronic obstructive pulmonary disease

## 2020-03-17 ENCOUNTER — Other Ambulatory Visit: Payer: Self-pay | Admitting: Family Medicine

## 2020-03-17 DIAGNOSIS — Z1231 Encounter for screening mammogram for malignant neoplasm of breast: Secondary | ICD-10-CM

## 2020-04-19 DIAGNOSIS — H52203 Unspecified astigmatism, bilateral: Secondary | ICD-10-CM | POA: Diagnosis not present

## 2020-04-19 DIAGNOSIS — Z961 Presence of intraocular lens: Secondary | ICD-10-CM | POA: Diagnosis not present

## 2020-04-19 DIAGNOSIS — E113293 Type 2 diabetes mellitus with mild nonproliferative diabetic retinopathy without macular edema, bilateral: Secondary | ICD-10-CM | POA: Diagnosis not present

## 2020-05-03 ENCOUNTER — Other Ambulatory Visit: Payer: Self-pay

## 2020-05-03 ENCOUNTER — Ambulatory Visit
Admission: RE | Admit: 2020-05-03 | Discharge: 2020-05-03 | Disposition: A | Discharge status: Home / Self Care | Payer: Medicare PPO | Source: Ambulatory Visit | Attending: Family Medicine | Admitting: Family Medicine

## 2020-05-03 DIAGNOSIS — Z1231 Encounter for screening mammogram for malignant neoplasm of breast: Secondary | ICD-10-CM

## 2020-06-16 DIAGNOSIS — E785 Hyperlipidemia, unspecified: Secondary | ICD-10-CM | POA: Diagnosis not present

## 2020-06-16 DIAGNOSIS — E1142 Type 2 diabetes mellitus with diabetic polyneuropathy: Secondary | ICD-10-CM | POA: Diagnosis not present

## 2020-06-16 DIAGNOSIS — E1121 Type 2 diabetes mellitus with diabetic nephropathy: Secondary | ICD-10-CM | POA: Diagnosis not present

## 2020-06-16 DIAGNOSIS — Z0000 Encounter for general adult medical examination without abnormal findings: Secondary | ICD-10-CM | POA: Diagnosis not present

## 2020-06-16 DIAGNOSIS — I129 Hypertensive chronic kidney disease with stage 1 through stage 4 chronic kidney disease, or unspecified chronic kidney disease: Secondary | ICD-10-CM | POA: Diagnosis not present

## 2020-06-16 DIAGNOSIS — M792 Neuralgia and neuritis, unspecified: Secondary | ICD-10-CM | POA: Diagnosis not present

## 2020-06-16 DIAGNOSIS — D649 Anemia, unspecified: Secondary | ICD-10-CM | POA: Diagnosis not present

## 2020-06-16 DIAGNOSIS — Z1389 Encounter for screening for other disorder: Secondary | ICD-10-CM | POA: Diagnosis not present

## 2020-06-16 DIAGNOSIS — N183 Chronic kidney disease, stage 3 unspecified: Secondary | ICD-10-CM | POA: Diagnosis not present

## 2020-06-22 DIAGNOSIS — D649 Anemia, unspecified: Secondary | ICD-10-CM | POA: Diagnosis not present

## 2020-07-14 DIAGNOSIS — D649 Anemia, unspecified: Secondary | ICD-10-CM | POA: Diagnosis not present

## 2020-07-14 DIAGNOSIS — M419 Scoliosis, unspecified: Secondary | ICD-10-CM | POA: Diagnosis not present

## 2020-07-14 DIAGNOSIS — I129 Hypertensive chronic kidney disease with stage 1 through stage 4 chronic kidney disease, or unspecified chronic kidney disease: Secondary | ICD-10-CM | POA: Diagnosis not present

## 2020-07-14 DIAGNOSIS — K219 Gastro-esophageal reflux disease without esophagitis: Secondary | ICD-10-CM | POA: Diagnosis not present

## 2020-09-05 DIAGNOSIS — R519 Headache, unspecified: Secondary | ICD-10-CM | POA: Diagnosis not present

## 2020-09-05 DIAGNOSIS — H0100A Unspecified blepharitis right eye, upper and lower eyelids: Secondary | ICD-10-CM | POA: Diagnosis not present

## 2020-09-05 DIAGNOSIS — H04123 Dry eye syndrome of bilateral lacrimal glands: Secondary | ICD-10-CM | POA: Diagnosis not present

## 2020-09-05 DIAGNOSIS — H0100B Unspecified blepharitis left eye, upper and lower eyelids: Secondary | ICD-10-CM | POA: Diagnosis not present

## 2020-10-13 DIAGNOSIS — M542 Cervicalgia: Secondary | ICD-10-CM | POA: Diagnosis not present

## 2020-10-13 DIAGNOSIS — R519 Headache, unspecified: Secondary | ICD-10-CM | POA: Diagnosis not present

## 2020-12-22 DIAGNOSIS — E785 Hyperlipidemia, unspecified: Secondary | ICD-10-CM | POA: Diagnosis not present

## 2020-12-22 DIAGNOSIS — K219 Gastro-esophageal reflux disease without esophagitis: Secondary | ICD-10-CM | POA: Diagnosis not present

## 2020-12-22 DIAGNOSIS — I129 Hypertensive chronic kidney disease with stage 1 through stage 4 chronic kidney disease, or unspecified chronic kidney disease: Secondary | ICD-10-CM | POA: Diagnosis not present

## 2020-12-22 DIAGNOSIS — D649 Anemia, unspecified: Secondary | ICD-10-CM | POA: Diagnosis not present

## 2020-12-22 DIAGNOSIS — E1121 Type 2 diabetes mellitus with diabetic nephropathy: Secondary | ICD-10-CM | POA: Diagnosis not present

## 2020-12-22 DIAGNOSIS — E1142 Type 2 diabetes mellitus with diabetic polyneuropathy: Secondary | ICD-10-CM | POA: Diagnosis not present

## 2021-01-10 DIAGNOSIS — H608X2 Other otitis externa, left ear: Secondary | ICD-10-CM | POA: Diagnosis not present

## 2021-02-26 DIAGNOSIS — M542 Cervicalgia: Secondary | ICD-10-CM | POA: Diagnosis not present

## 2021-02-26 DIAGNOSIS — R519 Headache, unspecified: Secondary | ICD-10-CM | POA: Diagnosis not present

## 2021-02-26 DIAGNOSIS — M6281 Muscle weakness (generalized): Secondary | ICD-10-CM | POA: Diagnosis not present

## 2021-03-05 DIAGNOSIS — M6281 Muscle weakness (generalized): Secondary | ICD-10-CM | POA: Diagnosis not present

## 2021-03-05 DIAGNOSIS — M542 Cervicalgia: Secondary | ICD-10-CM | POA: Diagnosis not present

## 2021-03-05 DIAGNOSIS — R519 Headache, unspecified: Secondary | ICD-10-CM | POA: Diagnosis not present

## 2021-03-08 ENCOUNTER — Encounter (INDEPENDENT_AMBULATORY_CARE_PROVIDER_SITE_OTHER): Payer: Medicare PPO | Admitting: Ophthalmology

## 2021-03-08 ENCOUNTER — Encounter (INDEPENDENT_AMBULATORY_CARE_PROVIDER_SITE_OTHER): Payer: Self-pay | Admitting: Ophthalmology

## 2021-03-08 ENCOUNTER — Ambulatory Visit (INDEPENDENT_AMBULATORY_CARE_PROVIDER_SITE_OTHER): Payer: Medicare Other | Admitting: Ophthalmology

## 2021-03-08 ENCOUNTER — Other Ambulatory Visit: Payer: Self-pay

## 2021-03-08 DIAGNOSIS — E113292 Type 2 diabetes mellitus with mild nonproliferative diabetic retinopathy without macular edema, left eye: Secondary | ICD-10-CM | POA: Diagnosis not present

## 2021-03-08 DIAGNOSIS — Z794 Long term (current) use of insulin: Secondary | ICD-10-CM | POA: Diagnosis not present

## 2021-03-08 DIAGNOSIS — H43813 Vitreous degeneration, bilateral: Secondary | ICD-10-CM

## 2021-03-08 DIAGNOSIS — E113293 Type 2 diabetes mellitus with mild nonproliferative diabetic retinopathy without macular edema, bilateral: Secondary | ICD-10-CM | POA: Diagnosis not present

## 2021-03-08 NOTE — Assessment & Plan Note (Signed)
The nature of mild nonproliferative diabetic retinopathy was discussed with the patient. Emphasis was placed on tight glucose, blood pressure, and serum lipid control. Avoidance of smoking was emphasized. Maintenance of normal body weight was emphasized. Appropriate follow up dilated exam is 1 year. 

## 2021-03-08 NOTE — Assessment & Plan Note (Signed)
Physiologic OU stable 

## 2021-03-08 NOTE — Progress Notes (Signed)
03/08/2021     CHIEF COMPLAINT Patient presents for  Chief Complaint  Patient presents with   Retina Follow Up      HISTORY OF PRESENT ILLNESS: Elizabeth Cantu is a 85 y.o. female who presents to the clinic today for:   HPI     Retina Follow Up   Patient presents with  Diabetic Retinopathy.  In both eyes.  This started 1 year ago.  Duration of 1 year.  Since onset it is stable.        Comments   1 year f/u OU with OCT      Last edited by Reather Littler, COA on 03/08/2021  3:25 PM.      Referring physician: Kelton Pillar, MD [address],  [place] [address]  HISTORICAL INFORMATION:   Selected notes from the [facility]: No current outpatient medications on file. (Ophthalmic Drugs)   No current facility-administered medications for this visit. (Ophthalmic Drugs)   Current Outpatient Medications (Other)  Medication Sig   aspirin 81 MG tablet Take 81 mg by mouth daily.   calcium carbonate (OS-CAL) 600 MG TABS tablet Take 600 mg by mouth daily.   Cyanocobalamin (VITAMIN B-12 PO) Take 2,500 mcg by mouth daily.   diltiazem (TIAZAC) 300 MG 24 hr capsule Take 300 mg by mouth daily.   ferrous sulfate 325 (65 FE) MG tablet Take 325 mg by mouth daily with breakfast.   hydrochlorothiazide (HYDRODIURIL) 25 MG tablet Take 25 mg by mouth daily.   HYDROcodone-acetaminophen (NORCO/VICODIN) 5-325 MG tablet TK 1 T PO BID PRN P   losartan (COZAAR) 50 MG tablet Take 50 mg by mouth daily.   lovastatin (MEVACOR) 40 MG tablet Take 40 mg by mouth daily.   LYRICA 50 MG capsule TK 1 C  PO QHS   metFORMIN (GLUCOPHAGE) 1000 MG tablet Take 1,000 mg by mouth 2 (two) times daily with a meal.   pantoprazole (PROTONIX) 40 MG tablet Take 40 mg by mouth daily.   PARoxetine (PAXIL) 10 MG tablet Take 10 mg by mouth daily.   potassium chloride SA (K-DUR,KLOR-CON) 20 MEQ tablet Take 20 mEq by mouth 2 (two) times daily.   No current  facility-administered medications for this visit. (Other)      REVIEW OF SYSTEMS:    ALLERGIES Allergies  Allergen Reactions   Penicillins     PAST MEDICAL HISTORY Past Medical History:  Diagnosis Date   Diabetes mellitus without complication ([place])    Hypertension    Past Surgical History:  Procedure Laterality Date   ABDOMINAL HYSTERECTOMY     CATARACT EXTRACTION Bilateral    ORIF FOOT FRACTURE Right     FAMILY HISTORY History reviewed. No pertinent family history.  SOCIAL HISTORY Social History   Tobacco Use   Smoking status: Never   Smokeless tobacco: Current  Substance Use Topics   Alcohol use: No   Drug use: No         OPHTHALMIC EXAM:  Base Eye Exam     Visual Acuity (ETDRS)       Right Left   Dist [place] 20/30 20/30 -1   Dist ph [place] 20/25 +2 20/20 -1         Tonometry (Tonopen, 3:34 PM)       Right Left   Pressure 16 17         Pupils       Pupils  Dark Light Shape React APD   Right PERRL 1.5 1 Round Minimal None   Left PERRL 1.5 1 Round Minimal None         Visual Fields (Counting fingers)       Left Right    Full Full         Extraocular Movement       Right Left    Full, Ortho Full, Ortho         Neuro/Psych     Oriented x3: Yes   Mood/Affect: Normal         Dilation     Both eyes: 1.0% Mydriacyl, 2.5% Phenylephrine @ 3:32 PM           Slit Lamp and Fundus Exam     External Exam       Right Left   External Normal Normal         Slit Lamp Exam       Right Left   Lids/Lashes Normal Normal   Conjunctiva/Sclera White and quiet White and quiet   Cornea Clear Clear   Anterior Chamber Deep and quiet Deep and quiet   Iris Round and reactive Round and reactive   Lens Centered posterior chamber intraocular lens Centered posterior chamber intraocular lens   Anterior Vitreous Normal Normal         Fundus Exam       Right Left   Posterior Vitreous Posterior vitreous detachment Posterior  vitreous detachment   Disc Normal Normal   C/D Ratio 0.0 0.0   Macula Normal Normal   Vessels NPDR- Mild NPDR- Mild   Periphery Normal Normal            IMAGING AND PROCEDURES  Imaging and Procedures for 03/08/21  OCT, Retina - OU - Both Eyes       Right Eye Quality was good. Scan locations included subfoveal. Central Foveal Thickness: 288. Progression has been stable.   Left Eye Quality was good. Scan locations included subfoveal. Central Foveal Thickness: 288. Progression has been stable.   Notes Bilateral PVD, no active maculopathy OU             ASSESSMENT/PLAN:  Mild nonproliferative diabetic retinopathy of both eyes (HCC) The nature of mild nonproliferative diabetic retinopathy was discussed with the patient. Emphasis was placed on tight glucose, blood pressure, and serum lipid control. Avoidance of smoking was emphasized. Maintenance of normal body weight was emphasized. Appropriate follow up dilated exam is 1 year.  Posterior vitreous detachment of both eyes Physiologic OU stable     ICD-10-CM   1. Controlled type 2 diabetes mellitus with mild nonproliferative retinopathy of left eye, with long-term current use of insulin, macular edema presence unspecified (HCC)  E11.3292 OCT, Retina - OU - Both Eyes   Z79.4     2. Mild nonproliferative diabetic retinopathy of both eyes without macular edema associated with type 2 diabetes mellitus ([place])  H54.5625     3. Posterior vitreous detachment of both eyes  H43.813       1.  Bilateral mild NPDR with no signs of progression over the last 1 year.  2.  Bilateral physiologic PVD.  3.  Pseudophakia OU  Ophthalmic Meds Ordered this visit:  No orders of the defined types were placed in this encounter.      Return in about 1 year (around 03/08/2022) for COLOR FP, OCT.  There are no Patient Instructions on file for this visit.   Explained the diagnoses, plan,  and follow up with the patient and they  expressed understanding.  Patient expressed understanding of the importance of proper follow up care.   Clent Demark Aryahi Denzler M.D. Diseases & Surgery of the Retina and Vitreous [facility]ina [facility]abetic [place] 03/08/21     Abbreviations: M myopia (nearsighted); A astigmatism; H hyperopia (farsighted); P presbyopia; Mrx spectacle prescription;  CTL contact lenses; OD right eye; OS left eye; OU both eyes  XT exotropia; ET esotropia; PEK punctate epithelial keratitis; PEE punctate epithelial erosions; DES dry eye syndrome; MGD meibomian gland dysfunction; ATs artificial tears; PFAT's preservative free artificial tears; [place] nuclear sclerotic cataract; PSC posterior subcapsular cataract; ERM epi-retinal membrane; PVD posterior vitreous detachment; RD retinal detachment; DM diabetes mellitus; DR diabetic retinopathy; NPDR non-proliferative diabetic retinopathy; PDR proliferative diabetic retinopathy; CSME clinically significant macular edema; DME diabetic macular edema; dbh dot blot hemorrhages; CWS cotton wool spot; POAG primary open angle glaucoma; C/D cup-to-disc ratio; HVF humphrey visual field; GVF goldmann visual field; OCT optical coherence tomography; IOP intraocular pressure; BRVO Branch retinal vein occlusion; CRVO central retinal vein occlusion; CRAO central retinal artery occlusion; BRAO branch retinal artery occlusion; RT retinal tear; SB scleral buckle; PPV pars plana vitrectomy; VH Vitreous hemorrhage; PRP panretinal laser photocoagulation; IVK intravitreal kenalog; VMT vitreomacular traction; MH Macular hole;  NVD neovascularization of the disc; NVE neovascularization elsewhere; AREDS age related eye disease study; ARMD age related macular degeneration; POAG primary open angle glaucoma; EBMD epithelial/anterior basement membrane dystrophy; ACIOL anterior chamber intraocular lens; IOL intraocular lens; PCIOL posterior chamber intraocular lens; Phaco/IOL phacoemulsification with intraocular lens placement;  [place] photorefractive keratectomy; LASIK laser assisted in situ keratomileusis; HTN hypertension; DM diabetes mellitus; COPD chronic obstructive pulmonary disease

## 2021-04-06 DIAGNOSIS — E1121 Type 2 diabetes mellitus with diabetic nephropathy: Secondary | ICD-10-CM | POA: Diagnosis not present

## 2021-04-10 ENCOUNTER — Other Ambulatory Visit: Payer: Self-pay | Admitting: Family Medicine

## 2021-04-10 DIAGNOSIS — Z1231 Encounter for screening mammogram for malignant neoplasm of breast: Secondary | ICD-10-CM

## 2021-04-25 DIAGNOSIS — E113293 Type 2 diabetes mellitus with mild nonproliferative diabetic retinopathy without macular edema, bilateral: Secondary | ICD-10-CM | POA: Diagnosis not present

## 2021-04-25 DIAGNOSIS — Z961 Presence of intraocular lens: Secondary | ICD-10-CM | POA: Diagnosis not present

## 2021-04-25 DIAGNOSIS — H52203 Unspecified astigmatism, bilateral: Secondary | ICD-10-CM | POA: Diagnosis not present

## 2021-05-08 ENCOUNTER — Ambulatory Visit
Admission: RE | Admit: 2021-05-08 | Discharge: 2021-05-08 | Disposition: A | Discharge status: Home / Self Care | Payer: Medicare Other | Source: Ambulatory Visit | Attending: Family Medicine | Admitting: Family Medicine

## 2021-05-08 DIAGNOSIS — Z1231 Encounter for screening mammogram for malignant neoplasm of breast: Secondary | ICD-10-CM

## 2021-05-11 ENCOUNTER — Ambulatory Visit: Payer: Medicare Other

## 2021-06-22 DIAGNOSIS — E1142 Type 2 diabetes mellitus with diabetic polyneuropathy: Secondary | ICD-10-CM | POA: Diagnosis not present

## 2021-06-22 DIAGNOSIS — Z1389 Encounter for screening for other disorder: Secondary | ICD-10-CM | POA: Diagnosis not present

## 2021-06-22 DIAGNOSIS — I129 Hypertensive chronic kidney disease with stage 1 through stage 4 chronic kidney disease, or unspecified chronic kidney disease: Secondary | ICD-10-CM | POA: Diagnosis not present

## 2021-06-22 DIAGNOSIS — E113293 Type 2 diabetes mellitus with mild nonproliferative diabetic retinopathy without macular edema, bilateral: Secondary | ICD-10-CM | POA: Diagnosis not present

## 2021-06-22 DIAGNOSIS — E785 Hyperlipidemia, unspecified: Secondary | ICD-10-CM | POA: Diagnosis not present

## 2021-06-22 DIAGNOSIS — M792 Neuralgia and neuritis, unspecified: Secondary | ICD-10-CM | POA: Diagnosis not present

## 2021-06-22 DIAGNOSIS — Z0000 Encounter for general adult medical examination without abnormal findings: Secondary | ICD-10-CM | POA: Diagnosis not present

## 2021-06-22 DIAGNOSIS — K219 Gastro-esophageal reflux disease without esophagitis: Secondary | ICD-10-CM | POA: Diagnosis not present

## 2021-06-22 DIAGNOSIS — E1121 Type 2 diabetes mellitus with diabetic nephropathy: Secondary | ICD-10-CM | POA: Diagnosis not present

## 2021-06-22 DIAGNOSIS — D649 Anemia, unspecified: Secondary | ICD-10-CM | POA: Diagnosis not present

## 2021-10-02 ENCOUNTER — Ambulatory Visit: Payer: Medicare Other | Admitting: Podiatry

## 2021-10-10 ENCOUNTER — Encounter: Payer: Self-pay | Admitting: Podiatry

## 2021-10-10 ENCOUNTER — Ambulatory Visit: Payer: Medicare Other | Admitting: Podiatry

## 2021-10-10 DIAGNOSIS — L84 Corns and callosities: Secondary | ICD-10-CM | POA: Diagnosis not present

## 2021-10-10 DIAGNOSIS — E119 Type 2 diabetes mellitus without complications: Secondary | ICD-10-CM

## 2021-10-10 NOTE — Progress Notes (Signed)
  Subjective:  Patient ID: Elizabeth Cantu, female    DOB: March 22, 1936,   MRN: [id number]  Chief Complaint  Patient presents with   Callouses     Corn / callus trim    86 y.o. female presents for concern of callus on both of her pinky toes. Relates they have been present for months. Relates they have been painful. She is a diabetic and her last A1c is unknown. Denies any other pedal complaints. Denies n/v/f/c.   Past Medical History:  Diagnosis Date   Diabetes mellitus without complication ([place])    Hypertension     Objective:  Physical Exam: Vascular: DP/PT pulses 2/4 bilateral. CFT <3 seconds. Normal hair growth on digits. No edema.  Skin. No lacerations or abrasions bilateral feet. Hyperkeratotic lesions noted sub fifth metatarsal bilateral. Cored lesion noted sub third metatarsal on right. Lesion as well to plantar left heel  Musculoskeletal: MMT 5/5 bilateral lower extremities in DF, PF, Inversion and Eversion. Deceased ROM in DF of ankle joint.  Neurological: Sensation intact to light touch.   Assessment:   1. Corns and callosities   2. Type 2 diabetes mellitus without complication, unspecified whether long term insulin use ([name])      Plan:  Patient was evaluated and treated and all questions answered. -Discussed corns and calluses with patient and treatment options.  -Hyperkeratotic tissue was debrided with chisel without incident.  -Applied salycylic acid treatment to area with dressing. Advised to remove bandaging tomorrow.  -Encouraged daily moisturizing -Discussed use of pumice stone -Advised good supportive shoes and inserts -Patient to return to office as needed or sooner if condition worsens.   Lorenda Peck, DPM

## 2021-11-12 DIAGNOSIS — N183 Chronic kidney disease, stage 3 unspecified: Secondary | ICD-10-CM | POA: Diagnosis not present

## 2021-11-12 DIAGNOSIS — E1142 Type 2 diabetes mellitus with diabetic polyneuropathy: Secondary | ICD-10-CM | POA: Diagnosis not present

## 2021-11-12 DIAGNOSIS — G629 Polyneuropathy, unspecified: Secondary | ICD-10-CM | POA: Diagnosis not present

## 2021-11-12 DIAGNOSIS — R0602 Shortness of breath: Secondary | ICD-10-CM | POA: Diagnosis not present

## 2021-12-10 DIAGNOSIS — Z794 Long term (current) use of insulin: Secondary | ICD-10-CM | POA: Diagnosis not present

## 2021-12-10 DIAGNOSIS — E1121 Type 2 diabetes mellitus with diabetic nephropathy: Secondary | ICD-10-CM | POA: Diagnosis not present

## 2021-12-20 DIAGNOSIS — G629 Polyneuropathy, unspecified: Secondary | ICD-10-CM | POA: Diagnosis not present

## 2021-12-20 DIAGNOSIS — I129 Hypertensive chronic kidney disease with stage 1 through stage 4 chronic kidney disease, or unspecified chronic kidney disease: Secondary | ICD-10-CM | POA: Diagnosis not present

## 2021-12-20 DIAGNOSIS — E1142 Type 2 diabetes mellitus with diabetic polyneuropathy: Secondary | ICD-10-CM | POA: Diagnosis not present

## 2021-12-20 DIAGNOSIS — E785 Hyperlipidemia, unspecified: Secondary | ICD-10-CM | POA: Diagnosis not present

## 2021-12-20 DIAGNOSIS — N183 Chronic kidney disease, stage 3 unspecified: Secondary | ICD-10-CM | POA: Diagnosis not present

## 2022-01-09 DIAGNOSIS — E785 Hyperlipidemia, unspecified: Secondary | ICD-10-CM | POA: Diagnosis not present

## 2022-01-09 DIAGNOSIS — D649 Anemia, unspecified: Secondary | ICD-10-CM | POA: Diagnosis not present

## 2022-01-09 DIAGNOSIS — K219 Gastro-esophageal reflux disease without esophagitis: Secondary | ICD-10-CM | POA: Diagnosis not present

## 2022-01-09 DIAGNOSIS — I129 Hypertensive chronic kidney disease with stage 1 through stage 4 chronic kidney disease, or unspecified chronic kidney disease: Secondary | ICD-10-CM | POA: Diagnosis not present

## 2022-01-09 DIAGNOSIS — E1121 Type 2 diabetes mellitus with diabetic nephropathy: Secondary | ICD-10-CM | POA: Diagnosis not present

## 2022-01-22 ENCOUNTER — Encounter: Payer: Self-pay | Admitting: Neurology

## 2022-01-22 ENCOUNTER — Ambulatory Visit: Payer: Medicare Other | Admitting: Neurology

## 2022-01-22 VITALS — BP 158/83 | HR 81 | Ht 64.0 in | Wt 160.0 lb

## 2022-01-22 DIAGNOSIS — Z794 Long term (current) use of insulin: Secondary | ICD-10-CM | POA: Diagnosis not present

## 2022-01-22 DIAGNOSIS — G6289 Other specified polyneuropathies: Secondary | ICD-10-CM

## 2022-01-22 DIAGNOSIS — R208 Other disturbances of skin sensation: Secondary | ICD-10-CM | POA: Diagnosis not present

## 2022-01-22 DIAGNOSIS — E119 Type 2 diabetes mellitus without complications: Secondary | ICD-10-CM

## 2022-01-22 DIAGNOSIS — G8929 Other chronic pain: Secondary | ICD-10-CM | POA: Diagnosis not present

## 2022-01-22 DIAGNOSIS — M545 Low back pain, unspecified: Secondary | ICD-10-CM | POA: Insufficient documentation

## 2022-01-22 MED ORDER — IMIPRAMINE HCL 10 MG PO TABS: 10.0000 mg | ORAL_TABLET | Freq: Every day | ORAL | 5 refills | Status: DC

## 2022-01-22 MED ORDER — IMIPRAMINE HCL 10 MG PO TABS: ORAL_TABLET | ORAL | 5 refills | Status: DC

## 2022-01-22 NOTE — Progress Notes (Signed)
[facility]  PATIENT: Elizabeth Cantu DOB: 07-Nov-1935  REFERRING DOCTOR OR PCP: Early Osmond, MD SOURCE: Patient, notes from primary care, lab results  _________________________________   HISTORICAL  CHIEF COMPLAINT:  Chief Complaint  Patient presents with   New Patient (Initial Visit)    Rm 2,  pt states for 2-3 years she has been having neuropathy in hands and feet. She has been using medication Gabapentin along with a cream that helps some. (She has also tried lyrica    HISTORY OF PRESENT ILLNESS:  I had the pleasure of seeing your patient, Elizabeth Cantu, at [facility] for neurologic consultation regarding her dysesthetic pain.  She is an 86 year old woman who has had numbness and painful tingling in her feet since 2020 and in her hands f since 2022    pain is burning, stinging, and itching.    She uses an OTC cream with soe benefit.     She is also on gabapentin 300 mg po bid.    She has foot > hand numbness but is not sure if there is weakness.  She has had Type 2 IDDM since 1976.  The insulin was just recently started.    Besides neuropathy, she also has CRF.   She also has hypertension.     Her sleep is variable.  The cream helps her sleep.     She has nocturia just 1-2 a night.     She denies any autoimmune issues.   She denies dry eyes or mouth  Laboratory 11/14/2021: Creatinine is elevated at 1.74 and the eGFR is 29.  Electrolytes are normal.  Hemoglobin A1c is 8.5.  CBC is unremarkable.  B12 is normal.  REVIEW OF SYSTEMS: Constitutional: No fevers, chills, sweats, or change in appetite Eyes: No visual changes, double vision, eye pain Ear, nose and throat: No hearing loss, ear pain, nasal congestion, sore throat Cardiovascular: No chest pain, palpitations Respiratory:  No shortness of breath at rest or with exertion.   No wheezes GastrointestinaI: No nausea, vomiting, diarrhea, abdominal pain, fecal incontinence Genitourinary:   No dysuria, urinary retention or frequency.  No nocturia. Musculoskeletal:  No neck pain, back pain Integumentary: No rash, pruritus, skin lesions Neurological: as above Psychiatric: No depression at this time.  No anxiety Endocrine: No palpitations, diaphoresis, change in appetite, change in weigh or increased thirst Hematologic/Lymphatic:  No anemia, purpura, petechiae. Allergic/Immunologic: No itchy/runny eyes, nasal congestion, recent allergic reactions, rashes  ALLERGIES: Allergies  Allergen Reactions   Penicillins     HOME MEDICATIONS:  Current Outpatient Medications:    acetaminophen (TYLENOL) 500 MG tablet, Take 500 mg by mouth every 6 (six) hours as needed., Disp: , Rfl:    aspirin 81 MG tablet, Take 81 mg by mouth daily., Disp: , Rfl:    atorvastatin (LIPITOR) 20 MG tablet, Take 20 mg by mouth daily., Disp: , Rfl:    calcium carbonate (OS-CAL) 600 MG TABS tablet, Take 600 mg by mouth daily., Disp: , Rfl:    Cyanocobalamin (VITAMIN B-12 PO), Take 2,500 mcg by mouth daily., Disp: , Rfl:    diltiazem (CARDIZEM CD) 300 MG 24 hr capsule, Take 300 mg by mouth daily., Disp: , Rfl:    ferrous sulfate 325 (65 FE) MG tablet, Take 325 mg by mouth daily with breakfast., Disp: , Rfl:    gabapentin (NEURONTIN) 300 MG capsule, Take 300 mg by mouth 2 (two) times daily., Disp: , Rfl:    LANTUS SOLOSTAR 100  UNIT/ML Solostar Pen, Inject 24 Units into the skin at bedtime., Disp: , Rfl:    losartan (COZAAR) 100 MG tablet, Take 100 mg by mouth daily., Disp: , Rfl:    Menthol-Methyl Salicylate (PAIN RELIEVING) CREA, Apply topically., Disp: , Rfl:    pantoprazole (PROTONIX) 40 MG tablet, Take 40 mg by mouth daily., Disp: , Rfl:    Polyethyl Glycol-Propyl Glycol (SYSTANE FREE OP), Apply 1 drop to eye in the morning and at bedtime., Disp: , Rfl:    TRADJENTA 5 MG TABS tablet, Take 5 mg by mouth every morning., Disp: , Rfl:    imipramine (TOFRANIL) 10 MG tablet, Take one or two at bedtime, Disp: 60  tablet, Rfl: 5  PAST MEDICAL HISTORY: Past Medical History:  Diagnosis Date   Diabetes mellitus without complication ([place])    Hypertension     PAST SURGICAL HISTORY: Past Surgical History:  Procedure Laterality Date   ABDOMINAL HYSTERECTOMY     CATARACT EXTRACTION Bilateral    ORIF FOOT FRACTURE Right     FAMILY HISTORY: Family History  Problem Relation Age of Onset   Diabetes Mother     SOCIAL HISTORY: Social History   Socioeconomic History   Marital status: Widowed    Spouse name: Not on file   Number of children: Not on file   Years of education: Not on file   Highest education level: Not on file  Occupational History   Not on file  Tobacco Use   Smoking status: Never   Smokeless tobacco: Current  Substance and Sexual Activity   Alcohol use: No   Drug use: No   Sexual activity: Not on file  Other Topics Concern   Not on file  Social History Narrative   Not on file   Social Determinants of Health   Financial Resource Strain: Not on file  Food Insecurity: Not on file  Transportation Needs: Not on file  Physical Activity: Not on file  Stress: Not on file  Social Connections: Not on file  Intimate Partner Violence: Not on file       PHYSICAL EXAM  Vitals:   01/22/22 1353  BP: (!) 158/83  Pulse: 81  Weight: 160 lb (72.6 kg)  Height: 5' 4"  (1.626 m)    Body mass index is 27.46 kg/m.   General: The patient is well-developed and well-nourished and in no acute distress  HEENT:  Head is [place]/AT.  Sclera are anicteric.   Neck: No carotid bruits are noted.  The neck is nontender.  Cardiovascular: The heart has a regular rate and rhythm with a normal S1 and S2. There were no murmurs, gallops or rubs.    Skin: Extremities are without rash or  edema.  Musculoskeletal:  Back is nontender  Neurologic Exam  Mental status: The patient is alert and oriented x 3 at the time of the examination. The patient has apparent normal recent and remote  memory, with an apparently normal attention span and concentration ability.   Speech is normal.  Cranial nerves: Extraocular movements are full. Pupils are equal, round, and reactive to light and accomodation.  Facial strength and sensation was normal.  Trapezius and sternocleidomastoid strength is normal. No dysarthria is noted.  The tongue is midline, and the patient has symmetric elevation of the soft palate. No obvious hearing deficits are noted.  Motor:  Muscle bulk is normal.   Tone is normal. Strength is  4+/5 in the APB muscles and 5 / 5 in oter hand musces.  No Tinel's or Phalen's signs   Sensory: Sensory testing is intact to pinprick, soft touch and vibration sensation in the arms and proximal legs.  She has reduced pinprick and temperature sensation in the feet relative to above the ankles.  Vibration sensation is fairly intact.  Coordination: Cerebellar testing reveals good finger-nose-finger and heel-to-shin bilaterally.  Gait and station: Station is normal.   Gait is normal for age. Tandem gait is mildly wide but likely normal for age. Romberg is negative.   Reflexes: Deep tendon reflexes are symmetric and normal bilaterally.   Plantar responses are flexor.    DIAGNOSTIC DATA (LABS, IMAGING, TESTING) - I reviewed patient records, labs, notes, testing and imaging myself where available.  Lab Results  Component Value Date   WBC 5.8 10/09/2008   HGB 13.9 10/09/2008   HCT 41.0 10/09/2008   MCV 89.1 10/09/2008   PLT 229 10/09/2008      Component Value Date/Time   NA 141 10/09/2008 1245   K 3.6 10/09/2008 1245   CL 106 10/09/2008 1245   GLUCOSE 129 (H) 10/09/2008 1245   BUN 12 10/09/2008 1245   CREATININE 0.8 10/09/2008 1245       ASSESSMENT AND PLAN  Small fiber polyneuropathy - Plan: Sjogren's syndrome antibods(ssa + ssb), Sedimentation rate, C-reactive protein, Multiple Myeloma Panel (SPEP&IFE w/QIG)  Dysesthesia  Insulin dependent type 2 diabetes  mellitus (HCC)  Chronic right-sided low back pain, unspecified whether sciatica present  In summary, Elizabeth Cantu is an 86 year old woman with painful dysesthetia in her feet and hands most consistent with a small fiber polyneuropathy.  She has diabetes that has not always been well controlled and that is most likely the etiology for her small fiber polyneuropathy.  However, I will check some lab work to make sure that there is not an autoimmune disturbance playing a role.  Additionally, she has back pain and pain down the leg.  She walks bent over.  She could have spinal stenosis that could also be playing a role in her symptoms.  To help with the dysesthetic pain, I will have her start imipramine 10 mg and work up to 20 mg nightly.  If she does not have good benefit, we will consider checking an MRI and also consider EMG.  Thank you for asking me to see Elizabeth Cantu.  Please let me know if I can be of further assistance with her or other patients in the future     Nashon Erbes A. Felecia Shelling, MD, [facility] 3/57/0177, 9:39 PM Certified in Neurology, Clinical Neurophysiology, Sleep Medicine and Neuroimaging  [facility] [address], [place], [place] [address] [phone]

## 2022-01-26 LAB — MULTIPLE MYELOMA PANEL, SERUM
Albumin SerPl Elph-Mcnc: 3.9 g/dL (ref 2.9–4.4)
Albumin/Glob SerPl: 1.2 (ref 0.7–1.7)
Alpha 1: 0.2 g/dL (ref 0.0–0.4)
Alpha2 Glob SerPl Elph-Mcnc: 0.9 g/dL (ref 0.4–1.0)
B-Globulin SerPl Elph-Mcnc: 1 g/dL (ref 0.7–1.3)
Gamma Glob SerPl Elph-Mcnc: 1.4 g/dL (ref 0.4–1.8)
Globulin, Total: 3.4 g/dL (ref 2.2–3.9)
IgA/Immunoglobulin A, Serum: 307 mg/dL (ref 64–422)
IgG (Immunoglobin G), Serum: 1551 mg/dL (ref 586–1602)
IgM (Immunoglobulin M), Srm: 32 mg/dL (ref 26–217)
Total Protein: 7.3 g/dL (ref 6.0–8.5)

## 2022-01-26 LAB — SJOGREN'S SYNDROME ANTIBODS(SSA + SSB)
ENA SSA (RO) Ab: <0.2 AI (ref 0.0–0.9)
ENA SSB (LA) Ab: <0.2 AI (ref 0.0–0.9)

## 2022-01-26 LAB — SEDIMENTATION RATE: Sed Rate: 26 mm/hr (ref 0–40)

## 2022-01-26 LAB — C-REACTIVE PROTEIN: CRP: 3 mg/L (ref 0–10)

## 2022-01-29 ENCOUNTER — Telehealth: Payer: Self-pay

## 2022-01-29 NOTE — Telephone Encounter (Signed)
-----   Message from [place], [place] sent at 01/29/2022 12:59 PM EDT ----- Regarding: Please advise Called pt and informed her that her labs are normal. ----- Message ----- From: Darleen Crocker, RN Sent: 01/29/2022   7:41 AM EDT To: Beckie Salts, RMA   ----- Message ----- From: Britt Bottom, MD Sent: 01/27/2022  12:56 PM EDT To: G[facility] 1 Results  Please let the patient know that the lab work is fine.

## 2022-01-29 NOTE — Telephone Encounter (Signed)
Called pt and informed her that her labs are normal. Pt verbalized understanding. Pt had no questions at this time but was encouraged to call back if questions arise.

## 2022-02-14 DIAGNOSIS — E1121 Type 2 diabetes mellitus with diabetic nephropathy: Secondary | ICD-10-CM | POA: Diagnosis not present

## 2022-02-14 DIAGNOSIS — Z23 Encounter for immunization: Secondary | ICD-10-CM | POA: Diagnosis not present

## 2022-02-20 DIAGNOSIS — K219 Gastro-esophageal reflux disease without esophagitis: Secondary | ICD-10-CM | POA: Diagnosis not present

## 2022-02-20 DIAGNOSIS — E1121 Type 2 diabetes mellitus with diabetic nephropathy: Secondary | ICD-10-CM | POA: Diagnosis not present

## 2022-02-20 DIAGNOSIS — E785 Hyperlipidemia, unspecified: Secondary | ICD-10-CM | POA: Diagnosis not present

## 2022-02-20 DIAGNOSIS — I129 Hypertensive chronic kidney disease with stage 1 through stage 4 chronic kidney disease, or unspecified chronic kidney disease: Secondary | ICD-10-CM | POA: Diagnosis not present

## 2022-02-25 DIAGNOSIS — D631 Anemia in chronic kidney disease: Secondary | ICD-10-CM | POA: Diagnosis not present

## 2022-02-25 DIAGNOSIS — E1122 Type 2 diabetes mellitus with diabetic chronic kidney disease: Secondary | ICD-10-CM | POA: Diagnosis not present

## 2022-02-25 DIAGNOSIS — N189 Chronic kidney disease, unspecified: Secondary | ICD-10-CM | POA: Diagnosis not present

## 2022-02-25 DIAGNOSIS — N390 Urinary tract infection, site not specified: Secondary | ICD-10-CM | POA: Diagnosis not present

## 2022-02-25 DIAGNOSIS — I129 Hypertensive chronic kidney disease with stage 1 through stage 4 chronic kidney disease, or unspecified chronic kidney disease: Secondary | ICD-10-CM | POA: Diagnosis not present

## 2022-02-25 DIAGNOSIS — N2581 Secondary hyperparathyroidism of renal origin: Secondary | ICD-10-CM | POA: Diagnosis not present

## 2022-02-25 DIAGNOSIS — G6289 Other specified polyneuropathies: Secondary | ICD-10-CM | POA: Diagnosis not present

## 2022-02-25 DIAGNOSIS — N184 Chronic kidney disease, stage 4 (severe): Secondary | ICD-10-CM | POA: Diagnosis not present

## 2022-02-27 ENCOUNTER — Other Ambulatory Visit: Payer: Self-pay | Admitting: Nephrology

## 2022-02-27 DIAGNOSIS — N184 Chronic kidney disease, stage 4 (severe): Secondary | ICD-10-CM

## 2022-03-14 ENCOUNTER — Encounter (INDEPENDENT_AMBULATORY_CARE_PROVIDER_SITE_OTHER): Payer: Medicare Other | Admitting: Ophthalmology

## 2022-03-14 DIAGNOSIS — Z794 Long term (current) use of insulin: Secondary | ICD-10-CM | POA: Diagnosis not present

## 2022-03-14 DIAGNOSIS — H43813 Vitreous degeneration, bilateral: Secondary | ICD-10-CM | POA: Diagnosis not present

## 2022-03-14 DIAGNOSIS — E113293 Type 2 diabetes mellitus with mild nonproliferative diabetic retinopathy without macular edema, bilateral: Secondary | ICD-10-CM | POA: Diagnosis not present

## 2022-03-26 ENCOUNTER — Telehealth: Payer: Self-pay | Admitting: Neurology

## 2022-03-26 DIAGNOSIS — R269 Unspecified abnormalities of gait and mobility: Secondary | ICD-10-CM

## 2022-03-26 DIAGNOSIS — M5430 Sciatica, unspecified side: Secondary | ICD-10-CM

## 2022-03-26 DIAGNOSIS — R208 Other disturbances of skin sensation: Secondary | ICD-10-CM

## 2022-03-26 NOTE — Telephone Encounter (Signed)
[facility] Georgina Snell) Pt having increased sciatica pain. Having difficulty walking due to the pain. She has spoke with [place], Health Management Team, However Dr.Sater manages her Gabapentin. Contacting Dr. Felecia Shelling for any recommendations as far as pain management. Can reach out to the patient if need for more information or can call  us back.

## 2022-03-27 ENCOUNTER — Telehealth: Payer: Self-pay | Admitting: Neurology

## 2022-03-27 MED ORDER — GABAPENTIN 300 MG PO CAPS: 300.0000 mg | ORAL_CAPSULE | Freq: Three times a day (TID) | ORAL | 5 refills | Status: AC

## 2022-03-27 NOTE — Telephone Encounter (Signed)
Called pt back. Relayed Dr. Garth Bigness recommendation. She is agreeable to plan. E-scribed rx gabapentin to [facility].   Pt ok with going to [facility] imaging for MRI lumbar. Aware we will place order. Once approved via insurance, she will be contacted to schedule.

## 2022-03-27 NOTE — Telephone Encounter (Signed)
LVM for pt to call office

## 2022-03-27 NOTE — Telephone Encounter (Signed)
[employer] NPR sent to GI [phone] 

## 2022-03-27 NOTE — Addendum Note (Signed)
Addended by: Wyvonnia Lora on: 03/27/2022 11:58 AM   Modules accepted: Orders

## 2022-03-27 NOTE — Addendum Note (Signed)
Addended by: Wyvonnia Lora on: 03/27/2022 11:46 AM   Modules accepted: Orders

## 2022-03-27 NOTE — Telephone Encounter (Signed)
Pt called back. Requesting a call from the nurse.

## 2022-03-28 DIAGNOSIS — I129 Hypertensive chronic kidney disease with stage 1 through stage 4 chronic kidney disease, or unspecified chronic kidney disease: Secondary | ICD-10-CM | POA: Diagnosis not present

## 2022-03-28 DIAGNOSIS — E785 Hyperlipidemia, unspecified: Secondary | ICD-10-CM | POA: Diagnosis not present

## 2022-03-28 DIAGNOSIS — E1121 Type 2 diabetes mellitus with diabetic nephropathy: Secondary | ICD-10-CM | POA: Diagnosis not present

## 2022-03-28 DIAGNOSIS — N183 Chronic kidney disease, stage 3 unspecified: Secondary | ICD-10-CM | POA: Diagnosis not present

## 2022-04-09 DIAGNOSIS — K219 Gastro-esophageal reflux disease without esophagitis: Secondary | ICD-10-CM | POA: Diagnosis not present

## 2022-04-09 DIAGNOSIS — E1121 Type 2 diabetes mellitus with diabetic nephropathy: Secondary | ICD-10-CM | POA: Diagnosis not present

## 2022-04-09 DIAGNOSIS — N183 Chronic kidney disease, stage 3 unspecified: Secondary | ICD-10-CM | POA: Diagnosis not present

## 2022-04-09 DIAGNOSIS — E785 Hyperlipidemia, unspecified: Secondary | ICD-10-CM | POA: Diagnosis not present

## 2022-04-09 DIAGNOSIS — E1142 Type 2 diabetes mellitus with diabetic polyneuropathy: Secondary | ICD-10-CM | POA: Diagnosis not present

## 2022-04-10 ENCOUNTER — Encounter: Payer: Self-pay | Admitting: Neurology

## 2022-04-11 ENCOUNTER — Ambulatory Visit
Admission: RE | Admit: 2022-04-11 | Discharge: 2022-04-11 | Disposition: A | Discharge status: Home / Self Care | Payer: Medicare Other | Source: Ambulatory Visit | Attending: Neurology | Admitting: Neurology

## 2022-04-11 DIAGNOSIS — R269 Unspecified abnormalities of gait and mobility: Secondary | ICD-10-CM | POA: Diagnosis not present

## 2022-04-11 DIAGNOSIS — R208 Other disturbances of skin sensation: Secondary | ICD-10-CM

## 2022-04-11 DIAGNOSIS — M5430 Sciatica, unspecified side: Secondary | ICD-10-CM | POA: Diagnosis not present

## 2022-04-11 DIAGNOSIS — M4316 Spondylolisthesis, lumbar region: Secondary | ICD-10-CM | POA: Diagnosis not present

## 2022-04-11 DIAGNOSIS — M545 Low back pain, unspecified: Secondary | ICD-10-CM | POA: Diagnosis not present

## 2022-04-11 DIAGNOSIS — M48061 Spinal stenosis, lumbar region without neurogenic claudication: Secondary | ICD-10-CM | POA: Diagnosis not present

## 2022-04-11 DIAGNOSIS — M47816 Spondylosis without myelopathy or radiculopathy, lumbar region: Secondary | ICD-10-CM | POA: Diagnosis not present

## 2022-04-12 ENCOUNTER — Telehealth: Payer: Self-pay | Admitting: Neurology

## 2022-04-12 DIAGNOSIS — M48061 Spinal stenosis, lumbar region without neurogenic claudication: Secondary | ICD-10-CM

## 2022-04-12 DIAGNOSIS — M5431 Sciatica, right side: Secondary | ICD-10-CM

## 2022-04-12 NOTE — Telephone Encounter (Signed)
The MRI of the lumbar spine shows multilevel spinal stenosis that is severe at L3-L4 and L4-L5.  There are various degrees of foraminal narrowing with potential for right L3, bilateral L5 and left S1 nerve root compression.  I discussed the finding with her and options and she would prefer to try an epidural before seeing a surgeon.  Therefore I will order this.

## 2022-05-08 ENCOUNTER — Inpatient Hospital Stay
Admission: RE | Admit: 2022-05-08 | Discharge: 2022-05-08 | Disposition: A | Discharge status: Home / Self Care | Payer: Medicare Other | Source: Ambulatory Visit | Attending: Neurology | Admitting: Neurology

## 2022-05-08 NOTE — Discharge Instructions (Signed)

## 2022-05-10 ENCOUNTER — Ambulatory Visit
Admission: RE | Admit: 2022-05-10 | Discharge: 2022-05-10 | Disposition: A | Discharge status: Home / Self Care | Payer: Medicare HMO | Source: Ambulatory Visit | Attending: Neurology | Admitting: Neurology

## 2022-05-10 DIAGNOSIS — M48061 Spinal stenosis, lumbar region without neurogenic claudication: Secondary | ICD-10-CM

## 2022-05-10 DIAGNOSIS — M47816 Spondylosis without myelopathy or radiculopathy, lumbar region: Secondary | ICD-10-CM | POA: Diagnosis not present

## 2022-05-10 DIAGNOSIS — M5431 Sciatica, right side: Secondary | ICD-10-CM

## 2022-05-10 MED ORDER — METHYLPREDNISOLONE ACETATE 40 MG/ML INJ SUSP (RADIOLOG
80.0000 mg | Freq: Once | INTRAMUSCULAR | Status: AC
  Administered 2022-05-10: 80 mg via EPIDURAL

## 2022-05-10 MED ORDER — IOPAMIDOL (ISOVUE-M 200) INJECTION 41%
1.0000 mL | Freq: Once | INTRAMUSCULAR | Status: AC
  Administered 2022-05-10: 1 mL via EPIDURAL

## 2022-05-10 NOTE — Discharge Instructions (Addendum)

## 2022-05-20 DIAGNOSIS — E1121 Type 2 diabetes mellitus with diabetic nephropathy: Secondary | ICD-10-CM | POA: Diagnosis not present

## 2022-05-24 DIAGNOSIS — E1142 Type 2 diabetes mellitus with diabetic polyneuropathy: Secondary | ICD-10-CM | POA: Diagnosis not present

## 2022-05-24 DIAGNOSIS — E785 Hyperlipidemia, unspecified: Secondary | ICD-10-CM | POA: Diagnosis not present

## 2022-05-24 DIAGNOSIS — K219 Gastro-esophageal reflux disease without esophagitis: Secondary | ICD-10-CM | POA: Diagnosis not present

## 2022-05-24 DIAGNOSIS — I129 Hypertensive chronic kidney disease with stage 1 through stage 4 chronic kidney disease, or unspecified chronic kidney disease: Secondary | ICD-10-CM | POA: Diagnosis not present

## 2022-05-24 DIAGNOSIS — D649 Anemia, unspecified: Secondary | ICD-10-CM | POA: Diagnosis not present

## 2022-05-24 DIAGNOSIS — E1121 Type 2 diabetes mellitus with diabetic nephropathy: Secondary | ICD-10-CM | POA: Diagnosis not present

## 2022-05-24 DIAGNOSIS — N183 Chronic kidney disease, stage 3 unspecified: Secondary | ICD-10-CM | POA: Diagnosis not present

## 2022-05-27 ENCOUNTER — Other Ambulatory Visit: Payer: Self-pay | Admitting: Nephrology

## 2022-05-27 DIAGNOSIS — N184 Chronic kidney disease, stage 4 (severe): Secondary | ICD-10-CM

## 2022-06-05 ENCOUNTER — Ambulatory Visit: Payer: Medicare HMO | Admitting: Neurology

## 2022-06-05 ENCOUNTER — Encounter: Payer: Self-pay | Admitting: Neurology

## 2022-06-05 VITALS — BP 151/80 | HR 90 | Ht 63.0 in | Wt 151.0 lb

## 2022-06-05 DIAGNOSIS — M48061 Spinal stenosis, lumbar region without neurogenic claudication: Secondary | ICD-10-CM | POA: Diagnosis not present

## 2022-06-05 DIAGNOSIS — R208 Other disturbances of skin sensation: Secondary | ICD-10-CM | POA: Diagnosis not present

## 2022-06-05 DIAGNOSIS — Z794 Long term (current) use of insulin: Secondary | ICD-10-CM

## 2022-06-05 DIAGNOSIS — G6289 Other specified polyneuropathies: Secondary | ICD-10-CM | POA: Diagnosis not present

## 2022-06-05 DIAGNOSIS — E119 Type 2 diabetes mellitus without complications: Secondary | ICD-10-CM | POA: Diagnosis not present

## 2022-06-05 MED ORDER — IMIPRAMINE HCL 10 MG PO TABS: ORAL_TABLET | ORAL | 11 refills | Status: DC

## 2022-06-05 NOTE — Progress Notes (Signed)
[facility]  PATIENT: Elizabeth Cantu DOB: 10/23/1935  REFERRING DOCTOR OR PCP: Early Osmond, MD SOURCE: Patient, notes from primary care, lab results  _________________________________   HISTORICAL  CHIEF COMPLAINT:  Chief Complaint  Patient presents with   Follow-up    RM 10, alone. C/o sciatic pain. Said the epidural did not help.    HISTORY OF PRESENT ILLNESS:  Elizabeth Cantu is a 87 y.o. woman with dysesthetic pain.  MRi lumbar showed multilevel spinal stenosis that is severe at L3-L4 and L4-L5. There are various degrees of foraminal narrowing with potential for right L3, bilateral L5 and left S1 nerve root compression.    She had an ESI which helped her LBP but not the leg pain  Imipramine 10 mg at night has helped the leg pain but she felt it flared up after the ESI.   She was already on gabapentin 300 mg po bid.     History of pain: She has had numbness and painful tingling in her feet since 2020 and in her hands f since 2022    pain is burning, stinging, and itching.    She uses an OTC cream with soe benefit.       She has foot > hand numbness but is not sure if there is weakness.       She has had Type 2 IDDM since 1976.  The insulin was just recently started.    Besides neuropathy, she also has CRF.   She also has hypertension.     Her sleep is variable.  The cream helps her sleep.     She has nocturia just 1-2 a night.     She denies any autoimmune issues.   She denies dry eyes or mouth  Laboratory 11/14/2021: Creatinine is elevated at 1.74 and the eGFR is 29.  Electrolytes are normal.  Hemoglobin A1c is 8.5.  CBC is unremarkable.  B12 is normal.  MRI 04/11/2022 showed MRi lumbar showed multilevel spinal stenosis that is severe at L3-L4 and L4-L5. There are various degrees of foraminal narrowing with potential for right L3, bilateral L5 and left S1 nerve root compression.     REVIEW OF SYSTEMS: Constitutional: No fevers, chills, sweats, or  change in appetite Eyes: No visual changes, double vision, eye pain Ear, nose and throat: No hearing loss, ear pain, nasal congestion, sore throat Cardiovascular: No chest pain, palpitations Respiratory:  No shortness of breath at rest or with exertion.   No wheezes GastrointestinaI: No nausea, vomiting, diarrhea, abdominal pain, fecal incontinence Genitourinary:  No dysuria, urinary retention or frequency.  No nocturia. Musculoskeletal:  No neck pain, back pain Integumentary: No rash, pruritus, skin lesions Neurological: as above Psychiatric: No depression at this time.  No anxiety Endocrine: No palpitations, diaphoresis, change in appetite, change in weigh or increased thirst Hematologic/Lymphatic:  No anemia, purpura, petechiae. Allergic/Immunologic: No itchy/runny eyes, nasal congestion, recent allergic reactions, rashes  ALLERGIES: Allergies  Allergen Reactions   Penicillins     HOME MEDICATIONS:  Current Outpatient Medications:    acetaminophen (TYLENOL) 500 MG tablet, Take 500 mg by mouth every 6 (six) hours as needed., Disp: , Rfl:    aspirin 81 MG tablet, Take 81 mg by mouth daily., Disp: , Rfl:    atorvastatin (LIPITOR) 20 MG tablet, Take 20 mg by mouth daily., Disp: , Rfl:    calcium carbonate (OS-CAL) 600 MG TABS tablet, Take 600 mg by mouth daily., Disp: , Rfl:    Cyanocobalamin (VITAMIN  B-12 PO), Take 2,500 mcg by mouth daily., Disp: , Rfl:    diltiazem (CARDIZEM CD) 300 MG 24 hr capsule, Take 300 mg by mouth daily., Disp: , Rfl:    ferrous sulfate 325 (65 FE) MG tablet, Take 325 mg by mouth daily with breakfast., Disp: , Rfl:    gabapentin (NEURONTIN) 300 MG capsule, Take 1 capsule (300 mg total) by mouth 3 (three) times daily., Disp: 90 capsule, Rfl: 5   LANTUS SOLOSTAR 100 UNIT/ML Solostar Pen, Inject 24 Units into the skin at bedtime., Disp: , Rfl:    losartan (COZAAR) 100 MG tablet, Take 100 mg by mouth daily., Disp: , Rfl:    Menthol-Methyl Salicylate (PAIN  RELIEVING) CREA, Apply topically., Disp: , Rfl:    pantoprazole (PROTONIX) 40 MG tablet, Take 40 mg by mouth daily., Disp: , Rfl:    Polyethyl Glycol-Propyl Glycol (SYSTANE FREE OP), Apply 1 drop to eye in the morning and at bedtime., Disp: , Rfl:    TRADJENTA 5 MG TABS tablet, Take 5 mg by mouth every morning., Disp: , Rfl:    imipramine (TOFRANIL) 10 MG tablet, Take two at bedtime, Disp: 60 tablet, Rfl: 11  PAST MEDICAL HISTORY: Past Medical History:  Diagnosis Date   Diabetes mellitus without complication ([place])    Hypertension     PAST SURGICAL HISTORY: Past Surgical History:  Procedure Laterality Date   ABDOMINAL HYSTERECTOMY     CATARACT EXTRACTION Bilateral    ORIF FOOT FRACTURE Right     FAMILY HISTORY: Family History  Problem Relation Age of Onset   Diabetes Mother     SOCIAL HISTORY: Social History   Socioeconomic History   Marital status: Widowed    Spouse name: Not on file   Number of children: Not on file   Years of education: Not on file   Highest education level: Not on file  Occupational History   Not on file  Tobacco Use   Smoking status: Never   Smokeless tobacco: Current  Substance and Sexual Activity   Alcohol use: No   Drug use: No   Sexual activity: Not on file  Other Topics Concern   Not on file  Social History Narrative   Not on file   Social Determinants of Health   Financial Resource Strain: Not on file  Food Insecurity: Not on file  Transportation Needs: Not on file  Physical Activity: Not on file  Stress: Not on file  Social Connections: Not on file  Intimate Partner Violence: Not on file       PHYSICAL EXAM  Vitals:   06/05/22 1350 06/05/22 1357  BP: (!) 161/84 (!) 151/80  Pulse: (!) 104 90  Weight: 151 lb (68.5 kg)   Height: 5' 3"$  (1.6 m)     Body mass index is 26.75 kg/m.   General: The patient is well-developed and well-nourished and in no acute distress  HEENT:  Head is [place]/AT.  Sclera are anicteric.     Skin: Extremities are without rash or edema.  Musculoskeletal:  Back is nontender  Neurologic Exam  Mental status: The patient is alert and oriented x 3 at the time of the examination. The patient has apparent normal recent and remote memory, with an apparently normal attention span and concentration ability.   Speech is normal.  Cranial nerves: Extraocular movements are full. Pupils are equal, round, and reactive to light and accomodation.  Facial strength and sensation was normal.  Trapezius and sternocleidomastoid strength is normal. No dysarthria  is noted.  The tongue is midline, and the patient has symmetric elevation of the soft palate. No obvious hearing deficits are noted.  Motor:  Muscle bulk is normal.   Tone is normal. Strength is  4+/5 in the APB muscles and 5 / 5 in oter hand musces.     Strengh is 4+/5 EHL muscles   No Tinel's or Phalen's signs   Sensory: Sensory testing is intact to pinprick, soft touch and vibration sensation in the arms and proximal legs.  She has reduced pinprick and temperature sensation in the feet relative to above the ankles.  Vibration sensation is fairly intact.  Coordination: Cerebellar testing reveals good finger-nose-finger and heel-to-shin bilaterally.  Gait and station: Station is normal.   Gait is normal for age. Tandem gait is mildly wide.  Walks better with her cane.   Romberg is negative.   Reflexes: Deep tendon reflexes are symmetric and normal bilaterally.       DIAGNOSTIC DATA (LABS, IMAGING, TESTING) - I reviewed patient records, labs, notes, testing and imaging myself where available.  Lab Results  Component Value Date   WBC 5.8 10/09/2008   HGB 13.9 10/09/2008   HCT 41.0 10/09/2008   MCV 89.1 10/09/2008   PLT 229 10/09/2008      Component Value Date/Time   NA 141 10/09/2008 1245   K 3.6 10/09/2008 1245   CL 106 10/09/2008 1245   GLUCOSE 129 (H) 10/09/2008 1245   BUN 12 10/09/2008 1245   CREATININE 0.8 10/09/2008 1245    PROT 7.3 01/22/2022 1502       ASSESSMENT AND PLAN  Spinal stenosis of lumbar region, unspecified whether neurogenic claudication present  Dysesthesia  Small fiber polyneuropathy  Insulin dependent type 2 diabetes mellitus (HCC)    Increase imipramine to 20 mg po qHS.  Ok to continue gabapentin She prefers not to do another ESI Rtc 12 months, sooner if new or worsening issues    Lille Karim A. Felecia Shelling, MD, [place] [id number], [id number] PM Certified in Neurology, Clinical Neurophysiology, Sleep Medicine and Neuroimaging  [facility] [address] [place], [place] [address] [phone]

## 2022-06-13 ENCOUNTER — Ambulatory Visit
Admission: RE | Admit: 2022-06-13 | Discharge: 2022-06-13 | Disposition: A | Discharge status: Home / Self Care | Payer: Medicare PPO | Source: Ambulatory Visit | Attending: Nephrology | Admitting: Nephrology

## 2022-06-13 DIAGNOSIS — I129 Hypertensive chronic kidney disease with stage 1 through stage 4 chronic kidney disease, or unspecified chronic kidney disease: Secondary | ICD-10-CM | POA: Diagnosis not present

## 2022-06-13 DIAGNOSIS — E1121 Type 2 diabetes mellitus with diabetic nephropathy: Secondary | ICD-10-CM | POA: Diagnosis not present

## 2022-06-13 DIAGNOSIS — N183 Chronic kidney disease, stage 3 unspecified: Secondary | ICD-10-CM | POA: Diagnosis not present

## 2022-06-13 DIAGNOSIS — N189 Chronic kidney disease, unspecified: Secondary | ICD-10-CM | POA: Diagnosis not present

## 2022-06-13 DIAGNOSIS — E785 Hyperlipidemia, unspecified: Secondary | ICD-10-CM | POA: Diagnosis not present

## 2022-06-13 DIAGNOSIS — K219 Gastro-esophageal reflux disease without esophagitis: Secondary | ICD-10-CM | POA: Diagnosis not present

## 2022-06-13 DIAGNOSIS — E1142 Type 2 diabetes mellitus with diabetic polyneuropathy: Secondary | ICD-10-CM | POA: Diagnosis not present

## 2022-06-13 DIAGNOSIS — D649 Anemia, unspecified: Secondary | ICD-10-CM | POA: Diagnosis not present

## 2022-06-13 DIAGNOSIS — N184 Chronic kidney disease, stage 4 (severe): Secondary | ICD-10-CM

## 2022-06-25 DIAGNOSIS — Z794 Long term (current) use of insulin: Secondary | ICD-10-CM | POA: Diagnosis not present

## 2022-06-25 DIAGNOSIS — N184 Chronic kidney disease, stage 4 (severe): Secondary | ICD-10-CM | POA: Diagnosis not present

## 2022-06-25 DIAGNOSIS — E1122 Type 2 diabetes mellitus with diabetic chronic kidney disease: Secondary | ICD-10-CM | POA: Diagnosis not present

## 2022-06-25 DIAGNOSIS — E1142 Type 2 diabetes mellitus with diabetic polyneuropathy: Secondary | ICD-10-CM | POA: Diagnosis not present

## 2022-06-25 DIAGNOSIS — Z0000 Encounter for general adult medical examination without abnormal findings: Secondary | ICD-10-CM | POA: Diagnosis not present

## 2022-06-25 DIAGNOSIS — R2689 Other abnormalities of gait and mobility: Secondary | ICD-10-CM | POA: Diagnosis not present

## 2022-06-25 DIAGNOSIS — N2581 Secondary hyperparathyroidism of renal origin: Secondary | ICD-10-CM | POA: Diagnosis not present

## 2022-06-25 DIAGNOSIS — E1121 Type 2 diabetes mellitus with diabetic nephropathy: Secondary | ICD-10-CM | POA: Diagnosis not present

## 2022-06-25 DIAGNOSIS — I129 Hypertensive chronic kidney disease with stage 1 through stage 4 chronic kidney disease, or unspecified chronic kidney disease: Secondary | ICD-10-CM | POA: Diagnosis not present

## 2022-06-25 DIAGNOSIS — E113293 Type 2 diabetes mellitus with mild nonproliferative diabetic retinopathy without macular edema, bilateral: Secondary | ICD-10-CM | POA: Diagnosis not present

## 2022-06-25 DIAGNOSIS — D509 Iron deficiency anemia, unspecified: Secondary | ICD-10-CM | POA: Diagnosis not present

## 2022-06-25 DIAGNOSIS — E785 Hyperlipidemia, unspecified: Secondary | ICD-10-CM | POA: Diagnosis not present

## 2022-06-26 ENCOUNTER — Other Ambulatory Visit: Payer: Self-pay | Admitting: Internal Medicine

## 2022-06-26 DIAGNOSIS — Z13820 Encounter for screening for osteoporosis: Secondary | ICD-10-CM

## 2022-06-28 ENCOUNTER — Other Ambulatory Visit: Payer: Self-pay | Admitting: Internal Medicine

## 2022-06-28 DIAGNOSIS — Z1231 Encounter for screening mammogram for malignant neoplasm of breast: Secondary | ICD-10-CM

## 2022-07-10 ENCOUNTER — Telehealth: Payer: Self-pay | Admitting: Neurology

## 2022-07-10 MED ORDER — LAMOTRIGINE 25 MG PO TABS: ORAL_TABLET | ORAL | 5 refills | Status: AC

## 2022-07-10 NOTE — Telephone Encounter (Signed)
Called Elizabeth Cantu and let her know that Dr.Sater wants for her to try lamotrigine as follows: 25 mg tablets #120  5 refill For 5 days take 1 pill a day.  For the next 5 days take 1 pill twice a day.  Then increase to 2 pills twice a day.  She can stop the imipramine after she gets started on the lamotrigine, also told Elizabeth Cantu if a rash develops she needs to stop the lamotrigine. Elizabeth Cantu verbalized understanding.

## 2022-07-10 NOTE — Telephone Encounter (Signed)
Pt called and asked when lamotrigine will be sent to  [place]. Pt is requesting a call back once it sent.

## 2022-07-10 NOTE — Telephone Encounter (Signed)
Pt called and stated she needs to talk with nurse about medication imipramine (TOFRANIL) 10 MG tablet. Stated Dr. Felecia Shelling upped medication because it may help thigh, pt stated this medication isn't helping and maybe he can call something else in for her. Pt is requesting a call back.

## 2022-07-10 NOTE — Telephone Encounter (Signed)
I called patient and discussed the below note. Patient states she has been taking imipramine 20 mg tablet at bedtime as directed from office visit on 06/05/22. Pt said the sharp pain still there and is trying to move down to her knee now. She states it hurts more when waking up in the am, the pain is  constant, hurts with walking, moving sitting. She asked if you have any other recommendations to help with this? Please advise

## 2022-07-10 NOTE — Addendum Note (Signed)
Addended by: Wyvonnia Lora on: 07/10/2022 04:14 PM   Modules accepted: Orders

## 2022-07-12 DIAGNOSIS — N183 Chronic kidney disease, stage 3 unspecified: Secondary | ICD-10-CM | POA: Diagnosis not present

## 2022-07-12 DIAGNOSIS — D649 Anemia, unspecified: Secondary | ICD-10-CM | POA: Diagnosis not present

## 2022-07-12 DIAGNOSIS — E1142 Type 2 diabetes mellitus with diabetic polyneuropathy: Secondary | ICD-10-CM | POA: Diagnosis not present

## 2022-07-12 DIAGNOSIS — E785 Hyperlipidemia, unspecified: Secondary | ICD-10-CM | POA: Diagnosis not present

## 2022-07-12 DIAGNOSIS — K219 Gastro-esophageal reflux disease without esophagitis: Secondary | ICD-10-CM | POA: Diagnosis not present

## 2022-07-12 DIAGNOSIS — E1121 Type 2 diabetes mellitus with diabetic nephropathy: Secondary | ICD-10-CM | POA: Diagnosis not present

## 2022-07-18 DIAGNOSIS — E1122 Type 2 diabetes mellitus with diabetic chronic kidney disease: Secondary | ICD-10-CM | POA: Diagnosis not present

## 2022-07-18 DIAGNOSIS — G6289 Other specified polyneuropathies: Secondary | ICD-10-CM | POA: Diagnosis not present

## 2022-07-18 DIAGNOSIS — D631 Anemia in chronic kidney disease: Secondary | ICD-10-CM | POA: Diagnosis not present

## 2022-07-18 DIAGNOSIS — Z9989 Dependence on other enabling machines and devices: Secondary | ICD-10-CM | POA: Diagnosis not present

## 2022-07-18 DIAGNOSIS — M48061 Spinal stenosis, lumbar region without neurogenic claudication: Secondary | ICD-10-CM | POA: Diagnosis not present

## 2022-07-18 DIAGNOSIS — M792 Neuralgia and neuritis, unspecified: Secondary | ICD-10-CM | POA: Diagnosis not present

## 2022-07-18 DIAGNOSIS — M25551 Pain in right hip: Secondary | ICD-10-CM | POA: Diagnosis not present

## 2022-07-18 DIAGNOSIS — N2581 Secondary hyperparathyroidism of renal origin: Secondary | ICD-10-CM | POA: Diagnosis not present

## 2022-07-18 DIAGNOSIS — I129 Hypertensive chronic kidney disease with stage 1 through stage 4 chronic kidney disease, or unspecified chronic kidney disease: Secondary | ICD-10-CM | POA: Diagnosis not present

## 2022-07-18 DIAGNOSIS — N184 Chronic kidney disease, stage 4 (severe): Secondary | ICD-10-CM | POA: Diagnosis not present

## 2022-07-19 ENCOUNTER — Ambulatory Visit
Admission: RE | Admit: 2022-07-19 | Discharge: 2022-07-19 | Disposition: A | Discharge status: Home / Self Care | Payer: Medicare PPO | Source: Ambulatory Visit | Attending: Internal Medicine | Admitting: Internal Medicine

## 2022-07-19 ENCOUNTER — Other Ambulatory Visit: Payer: Self-pay | Admitting: Internal Medicine

## 2022-07-19 DIAGNOSIS — M25551 Pain in right hip: Secondary | ICD-10-CM

## 2022-07-25 DIAGNOSIS — M25551 Pain in right hip: Secondary | ICD-10-CM | POA: Diagnosis not present

## 2022-07-30 DIAGNOSIS — M25551 Pain in right hip: Secondary | ICD-10-CM | POA: Diagnosis not present

## 2022-08-12 DIAGNOSIS — M25551 Pain in right hip: Secondary | ICD-10-CM | POA: Diagnosis not present

## 2022-08-13 ENCOUNTER — Ambulatory Visit
Admission: RE | Admit: 2022-08-13 | Discharge: 2022-08-13 | Disposition: A | Discharge status: Home / Self Care | Payer: Medicare PPO | Source: Ambulatory Visit | Attending: Internal Medicine

## 2022-08-13 DIAGNOSIS — Z1231 Encounter for screening mammogram for malignant neoplasm of breast: Secondary | ICD-10-CM

## 2022-08-19 DIAGNOSIS — M25551 Pain in right hip: Secondary | ICD-10-CM | POA: Diagnosis not present

## 2022-08-22 ENCOUNTER — Telehealth: Payer: Self-pay | Admitting: *Deleted

## 2022-08-22 NOTE — Telephone Encounter (Signed)
Took call from [place] in phone room and spoke w/ Raven at pt PCP office (Dr. Jacqulyn Bath). She asked what labs have been completed by our office. Advised we last collected labs 01/22/22 that included:  Advised per Dr. Epimenio Foot, labs ok. She asked we fax copy to [phone]. I faxed lab results, last office note and MRI lumbar report. Received fax confirmation.

## 2022-08-26 DIAGNOSIS — D649 Anemia, unspecified: Secondary | ICD-10-CM | POA: Diagnosis not present

## 2022-08-26 DIAGNOSIS — K219 Gastro-esophageal reflux disease without esophagitis: Secondary | ICD-10-CM | POA: Diagnosis not present

## 2022-08-26 DIAGNOSIS — E1121 Type 2 diabetes mellitus with diabetic nephropathy: Secondary | ICD-10-CM | POA: Diagnosis not present

## 2022-08-26 DIAGNOSIS — N183 Chronic kidney disease, stage 3 unspecified: Secondary | ICD-10-CM | POA: Diagnosis not present

## 2022-08-26 DIAGNOSIS — E785 Hyperlipidemia, unspecified: Secondary | ICD-10-CM | POA: Diagnosis not present

## 2022-08-26 DIAGNOSIS — E1142 Type 2 diabetes mellitus with diabetic polyneuropathy: Secondary | ICD-10-CM | POA: Diagnosis not present

## 2022-08-28 IMAGING — MG MM DIGITAL DIAGNOSTIC UNILAT*R* W/ TOMO W/ CAD
4 series · 4 of 12 positions shown · non-contrast
Comparison: Previous exam(s).

CLINICAL DATA: 83-year-old female presenting for evaluation of a
palpable lump in the upper inner right breast [REDACTED].

EXAM:
DIGITAL DIAGNOSTIC UNILATERAL RIGHT MAMMOGRAM WITH TOMO AND CAD;
ULTRASOUND RIGHT BREAST LIMITED

[R MLO synth-2D]
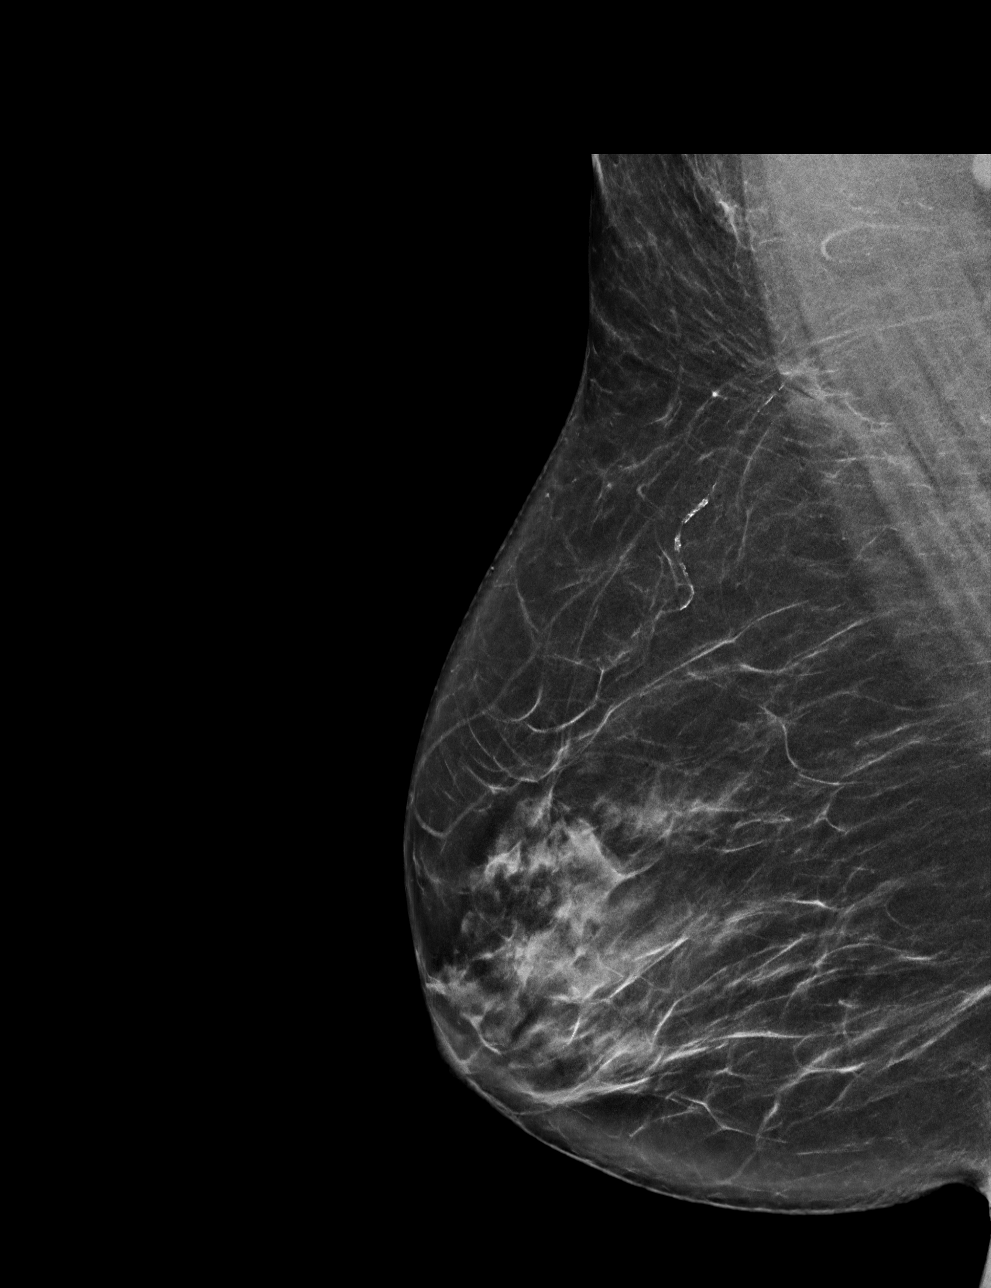

[R CC synth-2D]
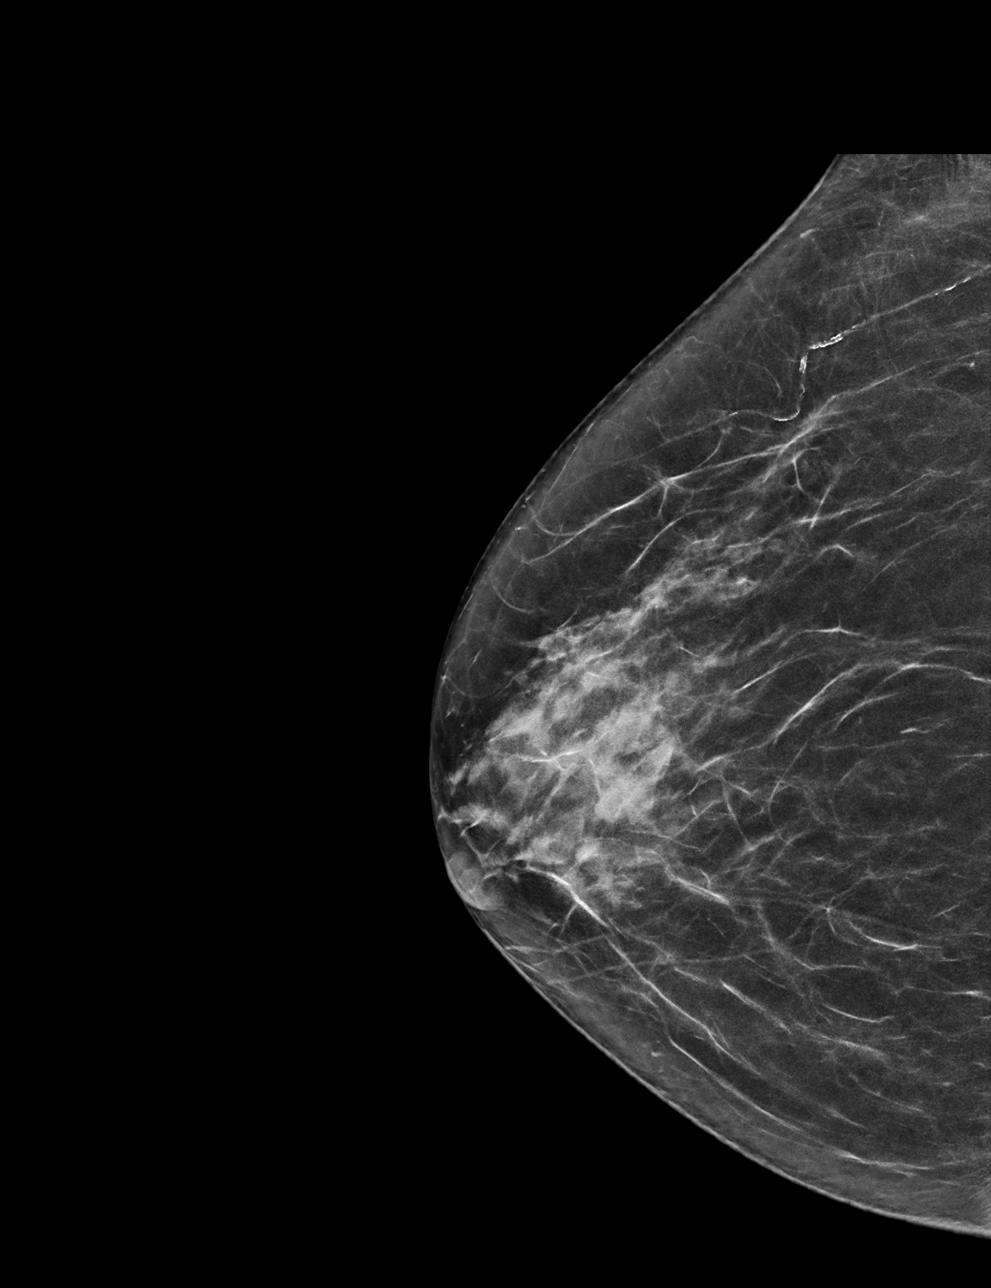

[R MLO tomo · tomo slice 33/66.0]
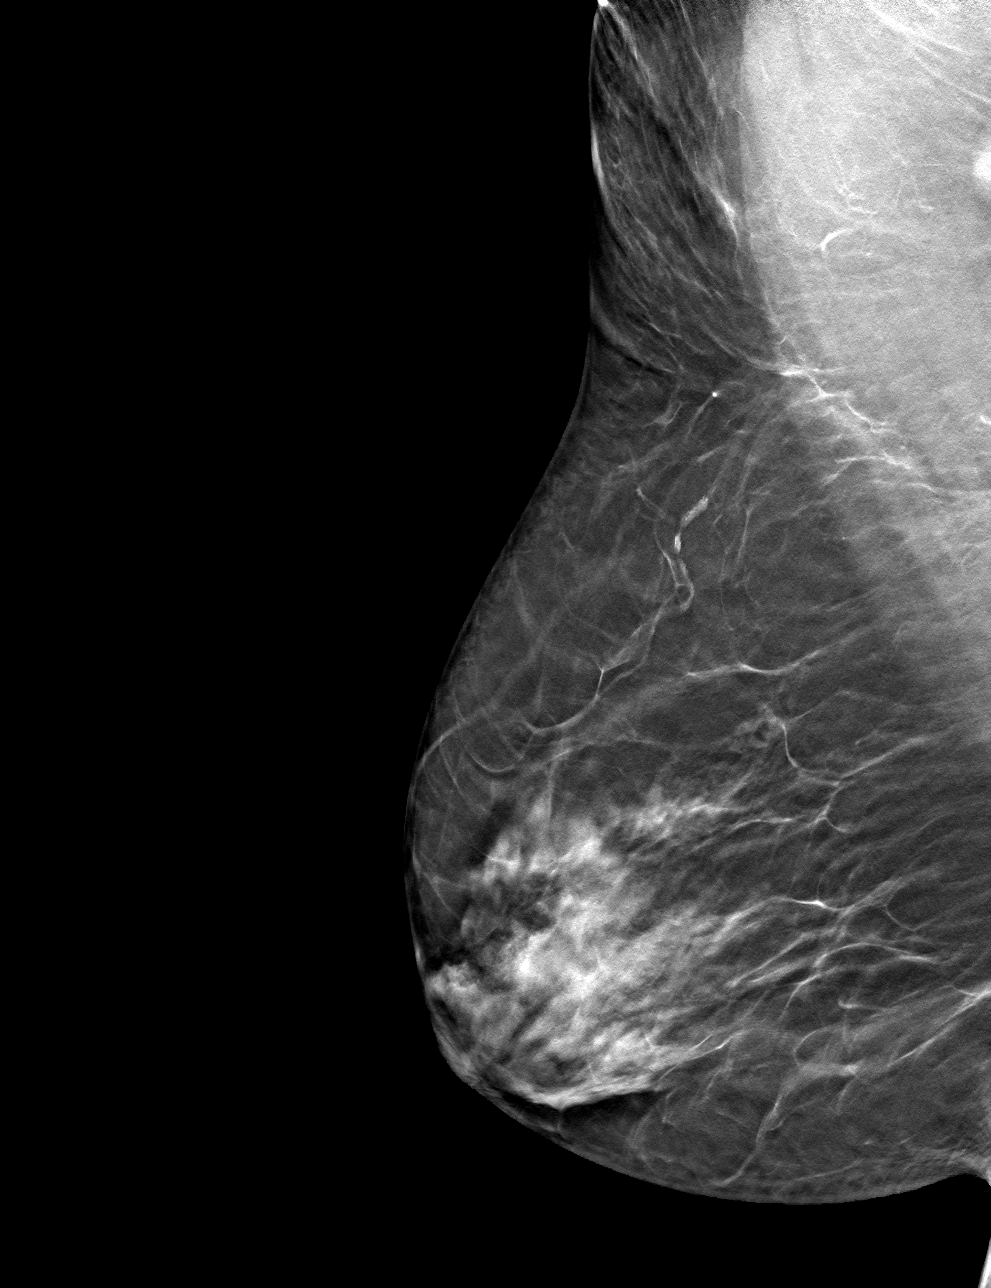

[R CC tomo · tomo slice 29/58.0]
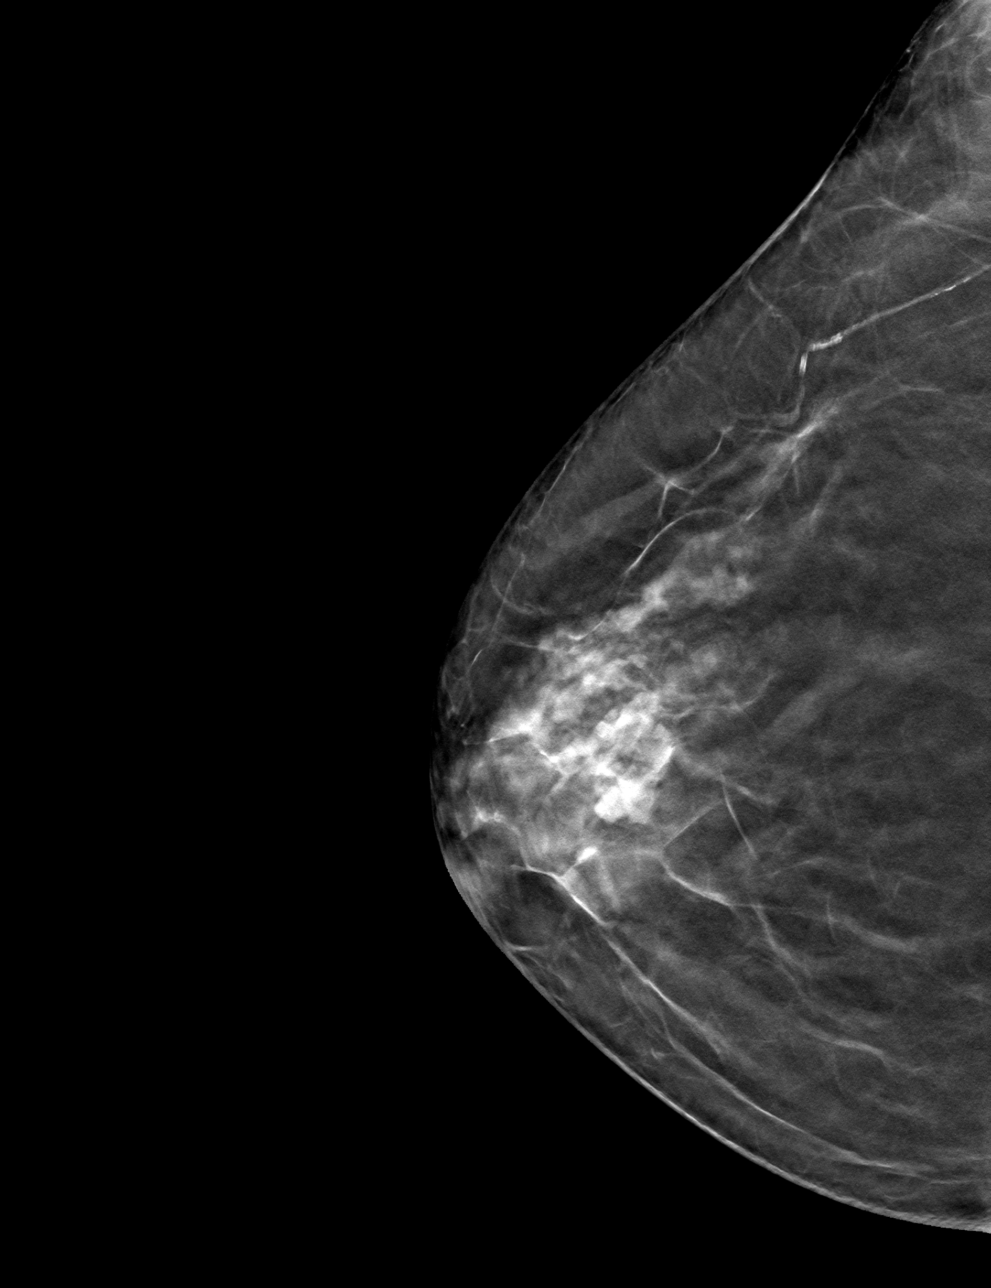

[4 of 12 positions shown; findings below may reference images not displayed]

ACR Breast Density Category c: The breast tissue is heterogeneously
dense, which may obscure small masses.
FINDINGS: No suspicious calcifications, masses or areas of distortion are seen
in the right breast. Due to the superior medial positioning of the
palpable area, it could not be included on the mammogram, and will
be further evaluated with ultrasound.

Mammographic images were processed with CAD.

Physical exam of the palpable site in the upper inner right breast
demonstrates an hard elongated ridge consistent with a rib.

Ultrasound targeted to the palpable site at 1 o'clock, 10 cm from
the nipple demonstrates normal fibroglandular tissue. There is a rib
immediately deep to the palpable area.
IMPRESSION: 1. No mammographic or targeted sonographic abnormalities are seen at
the palpable site of concern. The palpable lump corresponds with a
rib.

RECOMMENDATION:
Return to routine screening mammography is recommended. The patient
will be due for screening in Sunday March, 2020.

I have discussed the findings and recommendations with the patient.
If applicable, a reminder letter will be sent to the patient
regarding the next appointment.

BI-RADS CATEGORY  1: Negative.

## 2022-10-18 DIAGNOSIS — E113293 Type 2 diabetes mellitus with mild nonproliferative diabetic retinopathy without macular edema, bilateral: Secondary | ICD-10-CM | POA: Diagnosis not present

## 2022-10-18 DIAGNOSIS — H52203 Unspecified astigmatism, bilateral: Secondary | ICD-10-CM | POA: Diagnosis not present

## 2022-10-18 DIAGNOSIS — Z961 Presence of intraocular lens: Secondary | ICD-10-CM | POA: Diagnosis not present

## 2022-11-18 DIAGNOSIS — E1122 Type 2 diabetes mellitus with diabetic chronic kidney disease: Secondary | ICD-10-CM | POA: Diagnosis not present

## 2022-11-18 DIAGNOSIS — N1832 Chronic kidney disease, stage 3b: Secondary | ICD-10-CM | POA: Diagnosis not present

## 2022-11-18 DIAGNOSIS — N2581 Secondary hyperparathyroidism of renal origin: Secondary | ICD-10-CM | POA: Diagnosis not present

## 2022-11-18 DIAGNOSIS — D631 Anemia in chronic kidney disease: Secondary | ICD-10-CM | POA: Diagnosis not present

## 2022-11-18 DIAGNOSIS — I129 Hypertensive chronic kidney disease with stage 1 through stage 4 chronic kidney disease, or unspecified chronic kidney disease: Secondary | ICD-10-CM | POA: Diagnosis not present

## 2022-11-18 DIAGNOSIS — N184 Chronic kidney disease, stage 4 (severe): Secondary | ICD-10-CM | POA: Diagnosis not present

## 2022-11-18 DIAGNOSIS — G6289 Other specified polyneuropathies: Secondary | ICD-10-CM | POA: Diagnosis not present

## 2022-11-18 LAB — LAB REPORT - SCANNED
Albumin, Urine POC: 12.6
Creatinine, POC: 216.8 mg/dL
EGFR: 31
Microalb Creat Ratio: 6

## 2022-12-02 DIAGNOSIS — M48062 Spinal stenosis, lumbar region with neurogenic claudication: Secondary | ICD-10-CM | POA: Diagnosis not present

## 2022-12-02 DIAGNOSIS — Z6828 Body mass index (BMI) 28.0-28.9, adult: Secondary | ICD-10-CM | POA: Diagnosis not present

## 2022-12-24 DIAGNOSIS — N2581 Secondary hyperparathyroidism of renal origin: Secondary | ICD-10-CM | POA: Diagnosis not present

## 2022-12-24 DIAGNOSIS — I129 Hypertensive chronic kidney disease with stage 1 through stage 4 chronic kidney disease, or unspecified chronic kidney disease: Secondary | ICD-10-CM | POA: Diagnosis not present

## 2022-12-24 DIAGNOSIS — E785 Hyperlipidemia, unspecified: Secondary | ICD-10-CM | POA: Diagnosis not present

## 2022-12-24 DIAGNOSIS — G6289 Other specified polyneuropathies: Secondary | ICD-10-CM | POA: Diagnosis not present

## 2022-12-24 DIAGNOSIS — D631 Anemia in chronic kidney disease: Secondary | ICD-10-CM | POA: Diagnosis not present

## 2022-12-24 DIAGNOSIS — E1121 Type 2 diabetes mellitus with diabetic nephropathy: Secondary | ICD-10-CM | POA: Diagnosis not present

## 2022-12-24 DIAGNOSIS — M792 Neuralgia and neuritis, unspecified: Secondary | ICD-10-CM | POA: Diagnosis not present

## 2022-12-24 DIAGNOSIS — N184 Chronic kidney disease, stage 4 (severe): Secondary | ICD-10-CM | POA: Diagnosis not present

## 2022-12-24 DIAGNOSIS — K219 Gastro-esophageal reflux disease without esophagitis: Secondary | ICD-10-CM | POA: Diagnosis not present

## 2022-12-24 DIAGNOSIS — E1122 Type 2 diabetes mellitus with diabetic chronic kidney disease: Secondary | ICD-10-CM | POA: Diagnosis not present

## 2023-01-23 DIAGNOSIS — K219 Gastro-esophageal reflux disease without esophagitis: Secondary | ICD-10-CM | POA: Diagnosis not present

## 2023-01-23 DIAGNOSIS — E785 Hyperlipidemia, unspecified: Secondary | ICD-10-CM | POA: Diagnosis not present

## 2023-01-23 DIAGNOSIS — R0602 Shortness of breath: Secondary | ICD-10-CM | POA: Diagnosis not present

## 2023-01-23 DIAGNOSIS — E1142 Type 2 diabetes mellitus with diabetic polyneuropathy: Secondary | ICD-10-CM | POA: Diagnosis not present

## 2023-01-23 DIAGNOSIS — R051 Acute cough: Secondary | ICD-10-CM | POA: Diagnosis not present

## 2023-01-23 DIAGNOSIS — I129 Hypertensive chronic kidney disease with stage 1 through stage 4 chronic kidney disease, or unspecified chronic kidney disease: Secondary | ICD-10-CM | POA: Diagnosis not present

## 2023-01-23 DIAGNOSIS — R000 Tachycardia, unspecified: Secondary | ICD-10-CM | POA: Diagnosis not present

## 2023-01-29 ENCOUNTER — Other Ambulatory Visit: Payer: Self-pay | Admitting: Internal Medicine

## 2023-01-29 ENCOUNTER — Ambulatory Visit
Admission: RE | Admit: 2023-01-29 | Discharge: 2023-01-29 | Disposition: A | Discharge status: Home / Self Care | Payer: BC Managed Care – PPO | Source: Ambulatory Visit | Attending: Internal Medicine | Admitting: Internal Medicine

## 2023-01-29 DIAGNOSIS — R051 Acute cough: Secondary | ICD-10-CM

## 2023-01-29 DIAGNOSIS — J984 Other disorders of lung: Secondary | ICD-10-CM | POA: Diagnosis not present

## 2023-02-04 ENCOUNTER — Emergency Department (HOSPITAL_COMMUNITY): Payer: Medicare PPO

## 2023-02-04 ENCOUNTER — Other Ambulatory Visit: Payer: Self-pay

## 2023-02-04 ENCOUNTER — Inpatient Hospital Stay (HOSPITAL_COMMUNITY)
Admission: EM | Admit: 2023-02-04 | Discharge: 2023-02-08 | DRG: 180 | Disposition: A | Discharge status: Hospice | Payer: Medicare PPO | Attending: Family Medicine | Admitting: Family Medicine

## 2023-02-04 ENCOUNTER — Encounter (HOSPITAL_COMMUNITY): Payer: Self-pay

## 2023-02-04 DIAGNOSIS — E872 Acidosis, unspecified: Secondary | ICD-10-CM | POA: Diagnosis present

## 2023-02-04 DIAGNOSIS — Z833 Family history of diabetes mellitus: Secondary | ICD-10-CM

## 2023-02-04 DIAGNOSIS — Z7982 Long term (current) use of aspirin: Secondary | ICD-10-CM

## 2023-02-04 DIAGNOSIS — K769 Liver disease, unspecified: Secondary | ICD-10-CM

## 2023-02-04 DIAGNOSIS — Z79899 Other long term (current) drug therapy: Secondary | ICD-10-CM | POA: Diagnosis not present

## 2023-02-04 DIAGNOSIS — F17290 Nicotine dependence, other tobacco product, uncomplicated: Secondary | ICD-10-CM | POA: Diagnosis present

## 2023-02-04 DIAGNOSIS — R590 Localized enlarged lymph nodes: Secondary | ICD-10-CM | POA: Diagnosis not present

## 2023-02-04 DIAGNOSIS — E119 Type 2 diabetes mellitus without complications: Secondary | ICD-10-CM

## 2023-02-04 DIAGNOSIS — J910 Malignant pleural effusion: Secondary | ICD-10-CM | POA: Diagnosis present

## 2023-02-04 DIAGNOSIS — I10 Essential (primary) hypertension: Secondary | ICD-10-CM

## 2023-02-04 DIAGNOSIS — J189 Pneumonia, unspecified organism: Secondary | ICD-10-CM | POA: Diagnosis present

## 2023-02-04 DIAGNOSIS — C3492 Malignant neoplasm of unspecified part of left bronchus or lung: Principal | ICD-10-CM | POA: Diagnosis present

## 2023-02-04 DIAGNOSIS — Z880 Allergy status to penicillin: Secondary | ICD-10-CM | POA: Diagnosis not present

## 2023-02-04 DIAGNOSIS — N179 Acute kidney failure, unspecified: Secondary | ICD-10-CM | POA: Diagnosis not present

## 2023-02-04 DIAGNOSIS — Z794 Long term (current) use of insulin: Secondary | ICD-10-CM | POA: Diagnosis not present

## 2023-02-04 DIAGNOSIS — R000 Tachycardia, unspecified: Secondary | ICD-10-CM | POA: Diagnosis present

## 2023-02-04 DIAGNOSIS — J9811 Atelectasis: Secondary | ICD-10-CM | POA: Diagnosis not present

## 2023-02-04 DIAGNOSIS — J90 Pleural effusion, not elsewhere classified: Principal | ICD-10-CM | POA: Diagnosis present

## 2023-02-04 DIAGNOSIS — G8929 Other chronic pain: Secondary | ICD-10-CM | POA: Diagnosis not present

## 2023-02-04 DIAGNOSIS — Z90710 Acquired absence of both cervix and uterus: Secondary | ICD-10-CM | POA: Diagnosis not present

## 2023-02-04 DIAGNOSIS — R918 Other nonspecific abnormal finding of lung field: Secondary | ICD-10-CM | POA: Diagnosis not present

## 2023-02-04 DIAGNOSIS — N1832 Chronic kidney disease, stage 3b: Secondary | ICD-10-CM | POA: Diagnosis present

## 2023-02-04 DIAGNOSIS — E1122 Type 2 diabetes mellitus with diabetic chronic kidney disease: Secondary | ICD-10-CM | POA: Diagnosis present

## 2023-02-04 DIAGNOSIS — E785 Hyperlipidemia, unspecified: Secondary | ICD-10-CM | POA: Diagnosis present

## 2023-02-04 DIAGNOSIS — M549 Dorsalgia, unspecified: Secondary | ICD-10-CM | POA: Diagnosis present

## 2023-02-04 DIAGNOSIS — E11319 Type 2 diabetes mellitus with unspecified diabetic retinopathy without macular edema: Secondary | ICD-10-CM | POA: Diagnosis present

## 2023-02-04 DIAGNOSIS — C787 Secondary malignant neoplasm of liver and intrahepatic bile duct: Secondary | ICD-10-CM | POA: Diagnosis not present

## 2023-02-04 DIAGNOSIS — J9601 Acute respiratory failure with hypoxia: Secondary | ICD-10-CM | POA: Diagnosis not present

## 2023-02-04 DIAGNOSIS — J9809 Other diseases of bronchus, not elsewhere classified: Secondary | ICD-10-CM | POA: Diagnosis not present

## 2023-02-04 DIAGNOSIS — I129 Hypertensive chronic kidney disease with stage 1 through stage 4 chronic kidney disease, or unspecified chronic kidney disease: Secondary | ICD-10-CM | POA: Diagnosis present

## 2023-02-04 DIAGNOSIS — Z66 Do not resuscitate: Secondary | ICD-10-CM | POA: Diagnosis not present

## 2023-02-04 DIAGNOSIS — Z7984 Long term (current) use of oral hypoglycemic drugs: Secondary | ICD-10-CM | POA: Diagnosis not present

## 2023-02-04 DIAGNOSIS — C801 Malignant (primary) neoplasm, unspecified: Secondary | ICD-10-CM | POA: Diagnosis not present

## 2023-02-04 DIAGNOSIS — J168 Pneumonia due to other specified infectious organisms: Secondary | ICD-10-CM | POA: Diagnosis not present

## 2023-02-04 DIAGNOSIS — R0602 Shortness of breath: Secondary | ICD-10-CM | POA: Diagnosis not present

## 2023-02-04 DIAGNOSIS — I700 Atherosclerosis of aorta: Secondary | ICD-10-CM | POA: Diagnosis not present

## 2023-02-04 LAB — URINALYSIS, W/ REFLEX TO CULTURE (INFECTION SUSPECTED)
Bilirubin Urine: NEGATIVE
Glucose, UA: NEGATIVE mg/dL
Hgb urine dipstick: NEGATIVE
Ketones, ur: NEGATIVE mg/dL
Nitrite: NEGATIVE
Protein, ur: NEGATIVE mg/dL
Specific Gravity, Urine: 1.004 — ABNORMAL LOW (ref 1.005–1.030)
pH: 5 (ref 5.0–8.0)

## 2023-02-04 LAB — COMPREHENSIVE METABOLIC PANEL
ALT: 27 U/L (ref 0–44)
AST: 42 U/L — ABNORMAL HIGH (ref 15–41)
Albumin: 3.3 g/dL — ABNORMAL LOW (ref 3.5–5.0)
Alkaline Phosphatase: 87 U/L (ref 38–126)
Anion gap: 17 — ABNORMAL HIGH (ref 5–15)
BUN: 26 mg/dL — ABNORMAL HIGH (ref 8–23)
CO2: 21 mmol/L — ABNORMAL LOW (ref 22–32)
Calcium: 9 mg/dL (ref 8.9–10.3)
Chloride: 101 mmol/L (ref 98–111)
Creatinine, Ser: 1.69 mg/dL — ABNORMAL HIGH (ref 0.44–1.00)
GFR, Estimated: 30 mL/min — ABNORMAL LOW (ref >60)
Glucose, Bld: 154 mg/dL — ABNORMAL HIGH (ref 70–99)
Potassium: 3.6 mmol/L (ref 3.5–5.1)
Sodium: 139 mmol/L (ref 135–145)
Total Bilirubin: 0.8 mg/dL (ref 0.3–1.2)
Total Protein: 8.2 g/dL — ABNORMAL HIGH (ref 6.5–8.1)

## 2023-02-04 LAB — CBC WITH DIFFERENTIAL/PLATELET
Abs Immature Granulocytes: 0.03 10*3/uL (ref 0.00–0.07)
Basophils Absolute: 0 10*3/uL (ref 0.0–0.1)
Basophils Relative: 0 %
Eosinophils Absolute: 0.1 10*3/uL (ref 0.0–0.5)
Eosinophils Relative: 1 %
HCT: 44.8 % (ref 36.0–46.0)
Hemoglobin: 14.4 g/dL (ref 12.0–15.0)
Immature Granulocytes: 0 %
Lymphocytes Relative: 17 %
Lymphs Abs: 1.8 10*3/uL (ref 0.7–4.0)
MCH: 29.1 pg (ref 26.0–34.0)
MCHC: 32.1 g/dL (ref 30.0–36.0)
MCV: 90.7 fL (ref 80.0–100.0)
Monocytes Absolute: 0.9 10*3/uL (ref 0.1–1.0)
Monocytes Relative: 9 %
Neutro Abs: 7.4 10*3/uL (ref 1.7–7.7)
Neutrophils Relative %: 73 %
Platelets: 410 10*3/uL — ABNORMAL HIGH (ref 150–400)
RBC: 4.94 MIL/uL (ref 3.87–5.11)
RDW: 12.4 % (ref 11.5–15.5)
WBC: 10.3 10*3/uL (ref 4.0–10.5)
nRBC: 0 % (ref 0.0–0.2)

## 2023-02-04 LAB — I-STAT CG4 LACTIC ACID, ED: Lactic Acid, Venous: 2.5 mmol/L (ref 0.5–1.9)

## 2023-02-04 LAB — PROTIME-INR
INR: 1.1 (ref 0.8–1.2)
Prothrombin Time: 14.1 s (ref 11.4–15.2)

## 2023-02-04 LAB — HEMOGLOBIN A1C
Hgb A1c MFr Bld: 7.6 % — ABNORMAL HIGH (ref 4.8–5.6)
Mean Plasma Glucose: 171.42 mg/dL

## 2023-02-04 LAB — APTT: aPTT: 32 s (ref 24–36)

## 2023-02-04 LAB — BRAIN NATRIURETIC PEPTIDE: B Natriuretic Peptide: 46.8 pg/mL (ref 0.0–100.0)

## 2023-02-04 LAB — GLUCOSE, CAPILLARY: Glucose-Capillary: 143 mg/dL — ABNORMAL HIGH (ref 70–99)

## 2023-02-04 LAB — LACTIC ACID, PLASMA: Lactic Acid, Venous: 1.6 mmol/L (ref 0.5–1.9)

## 2023-02-04 MED ORDER — LOSARTAN POTASSIUM 50 MG PO TABS
100.0000 mg | ORAL_TABLET | Freq: Every day | ORAL | Status: DC
  Administered 2023-02-05 – 2023-02-08 (×4): 100 mg via ORAL
  Filled 2023-02-04 (×4): qty 2

## 2023-02-04 MED ORDER — DILTIAZEM HCL ER COATED BEADS 300 MG PO CP24
300.0000 mg | ORAL_CAPSULE | Freq: Every day | ORAL | Status: DC
  Administered 2023-02-05 – 2023-02-08 (×4): 300 mg via ORAL
  Filled 2023-02-04 (×4): qty 1

## 2023-02-04 MED ORDER — ALBUTEROL SULFATE HFA 108 (90 BASE) MCG/ACT IN AERS: 2.0000 | INHALATION_SPRAY | RESPIRATORY_TRACT | Status: DC | PRN

## 2023-02-04 MED ORDER — INSULIN ASPART 100 UNIT/ML IJ SOLN
0.0000 [IU] | Freq: Three times a day (TID) | INTRAMUSCULAR | Status: DC
  Administered 2023-02-04 – 2023-02-07 (×7): 1 [IU] via SUBCUTANEOUS
  Administered 2023-02-07: 2 [IU] via SUBCUTANEOUS
  Administered 2023-02-07: 1 [IU] via SUBCUTANEOUS
  Administered 2023-02-08: 2 [IU] via SUBCUTANEOUS
  Administered 2023-02-08: 1 [IU] via SUBCUTANEOUS

## 2023-02-04 MED ORDER — SODIUM CHLORIDE 0.9 % IV SOLN
1.0000 g | Freq: Once | INTRAVENOUS | Status: AC
  Administered 2023-02-04: 1 g via INTRAVENOUS
  Filled 2023-02-04: qty 10

## 2023-02-04 MED ORDER — PANTOPRAZOLE SODIUM 40 MG PO TBEC
40.0000 mg | DELAYED_RELEASE_TABLET | Freq: Every day | ORAL | Status: DC
  Administered 2023-02-05 – 2023-02-08 (×4): 40 mg via ORAL
  Filled 2023-02-04 (×4): qty 1

## 2023-02-04 MED ORDER — FERROUS SULFATE 325 (65 FE) MG PO TABS
325.0000 mg | ORAL_TABLET | Freq: Every day | ORAL | Status: DC
  Administered 2023-02-06 – 2023-02-08 (×3): 325 mg via ORAL
  Filled 2023-02-04 (×4): qty 1

## 2023-02-04 MED ORDER — SODIUM CHLORIDE 0.9 % IV SOLN
1.0000 g | INTRAVENOUS | Status: DC
  Administered 2023-02-05 – 2023-02-07 (×3): 1 g via INTRAVENOUS
  Filled 2023-02-04 (×3): qty 10

## 2023-02-04 MED ORDER — LAMOTRIGINE 25 MG PO TABS
50.0000 mg | ORAL_TABLET | Freq: Two times a day (BID) | ORAL | Status: DC
  Administered 2023-02-04 – 2023-02-08 (×8): 50 mg via ORAL
  Filled 2023-02-04 (×8): qty 2

## 2023-02-04 MED ORDER — CALCIUM CARBONATE 1250 (500 CA) MG PO TABS
1250.0000 mg | ORAL_TABLET | Freq: Every day | ORAL | Status: DC
  Administered 2023-02-05 – 2023-02-08 (×4): 1250 mg via ORAL
  Filled 2023-02-04 (×4): qty 1

## 2023-02-04 MED ORDER — DOXYCYCLINE HYCLATE 100 MG PO TABS
100.0000 mg | ORAL_TABLET | Freq: Once | ORAL | Status: AC
  Administered 2023-02-04: 100 mg via ORAL
  Filled 2023-02-04: qty 1

## 2023-02-04 MED ORDER — ATORVASTATIN CALCIUM 20 MG PO TABS
20.0000 mg | ORAL_TABLET | Freq: Every day | ORAL | Status: DC
  Administered 2023-02-05 – 2023-02-08 (×4): 20 mg via ORAL
  Filled 2023-02-04 (×4): qty 1

## 2023-02-04 MED ORDER — ASPIRIN 81 MG PO TBEC
81.0000 mg | DELAYED_RELEASE_TABLET | Freq: Every day | ORAL | Status: DC
  Administered 2023-02-05 – 2023-02-08 (×4): 81 mg via ORAL
  Filled 2023-02-04 (×4): qty 1

## 2023-02-04 MED ORDER — SODIUM CHLORIDE 0.9 % IV BOLUS
1000.0000 mL | Freq: Once | INTRAVENOUS | Status: AC
  Administered 2023-02-04: 1000 mL via INTRAVENOUS

## 2023-02-04 MED ORDER — GABAPENTIN 300 MG PO CAPS
300.0000 mg | ORAL_CAPSULE | Freq: Three times a day (TID) | ORAL | Status: DC
  Administered 2023-02-04 – 2023-02-08 (×12): 300 mg via ORAL
  Filled 2023-02-04 (×12): qty 1

## 2023-02-04 MED ORDER — DOXYCYCLINE HYCLATE 100 MG PO TABS
100.0000 mg | ORAL_TABLET | Freq: Two times a day (BID) | ORAL | Status: DC
  Administered 2023-02-05 – 2023-02-08 (×7): 100 mg via ORAL
  Filled 2023-02-04 (×7): qty 1

## 2023-02-04 NOTE — ED Triage Notes (Signed)
Family states they received pt got a call from doctors office stating she has pneumonia. Pt states she has been sob for the pas three weeks. Doctor advised pt to go to ED for treatment.

## 2023-02-04 NOTE — Assessment & Plan Note (Signed)
-  CO2 21 with anion gap of 17 -has received a liter of IV fluids

## 2023-02-04 NOTE — H&P (Signed)
History and Physical    Patient: Elizabeth Cantu:[id number] DOB: Nov 05, 1935 DOA: 02/04/2023 DOS: the patient was seen and examined on 02/04/2023 PCP: Ollen Bowl, MD  Patient coming from: Home  Chief Complaint:  Chief Complaint  Patient presents with   Shortness of Breath   HPI: Elizabeth Cantu is a 87 y.o. female with medical history significant of Insulin-dependent T2DM with retinopathy, chronic back pain who presents for loculated left sided pleural effusion and increase shortness of breath.   Pt limited historian. Reports shortness of breath for at least 3 weeks and has not been able to sleep well due to persistent cough. No chest pain. No nausea, vomiting or diarrhea. Still has good appetite. Has been losing weight. She has been following her primary physician for these symptoms but cannot recall what medications she has been getting. She smoked about 15 years in the 60s and current dips tobacco.   In the ED, she was afebrile, tachycardia, tachypnea and hypertensive to 149/108.Stable on room air.   No leukocytosis or anemia.   BMP notable for CO2 of 21 with anion gap of 17. Creatinine elevated at 1.69 but no prior for comparison.   CXR with worsening loculated left pleural fluid and ill-defined perihilar opacity in the left lung. May represent progressive pneumonia with associated parapneumonic effusion or empyema, underlying malignancy not excluded.   CT chest is pending at time of admission.   Pt started on Rocephin and doxycycline, administered a liter NS bolus.   Hospitalist then consulted for management.   Review of Systems: As mentioned in the history of present illness. All other systems reviewed and are negative. Past Medical History:  Diagnosis Date   Diabetes mellitus without complication (HCC)    Hypertension    Past Surgical History:  Procedure Laterality Date   ABDOMINAL HYSTERECTOMY     CATARACT EXTRACTION Bilateral    ORIF FOOT FRACTURE Right     Social History:  reports that she has never smoked. She uses smokeless tobacco. She reports that she does not drink alcohol and does not use drugs.  Allergies  Allergen Reactions   Penicillins     Family History  Problem Relation Age of Onset   Diabetes Mother     Prior to Admission medications   Medication Sig Start Date End Date Taking? Authorizing Provider  acetaminophen (TYLENOL) 500 MG tablet Take 500 mg by mouth every 6 (six) hours as needed.    [provider]  aspirin 81 MG tablet Take 81 mg by mouth daily.    [provider]  atorvastatin (LIPITOR) 20 MG tablet Take 20 mg by mouth daily. 10/26/21   [provider]  calcium carbonate (OS-CAL) 600 MG TABS tablet Take 600 mg by mouth daily.    [provider]  Cyanocobalamin (VITAMIN B-12 PO) Take 2,500 mcg by mouth daily.    [provider]  diltiazem (CARDIZEM CD) 300 MG 24 hr capsule Take 300 mg by mouth daily. 10/29/21   [provider]  ferrous sulfate 325 (65 FE) MG tablet Take 325 mg by mouth daily with breakfast.    [provider]  gabapentin (NEURONTIN) 300 MG capsule Take 1 capsule (300 mg total) by mouth 3 (three) times daily. 03/27/22   Sater, Pearletha Furl, MD  imipramine (TOFRANIL) 10 MG tablet Take two at bedtime 06/05/22   Sater, Pearletha Furl, MD  lamoTRIgine (LAMICTAL) 25 MG tablet For the first 5 days take 1 pill a day. Then, for  the next 5 days take 1 pill twice a day.  Then increase to 2 pills twice a day thereafter 07/10/22   Sater, Pearletha Furl, MD  LANTUS SOLOSTAR 100 UNIT/ML Solostar Pen Inject 24 Units into the skin at bedtime. 12/10/21   [provider]  losartan (COZAAR) 100 MG tablet Take 100 mg by mouth daily. 10/26/21   [provider]  Menthol-Methyl Salicylate (PAIN RELIEVING) CREA Apply topically.    [provider]  pantoprazole (PROTONIX) 40 MG tablet Take 40 mg by mouth daily.    [provider]  Polyethyl  Glycol-Propyl Glycol (SYSTANE FREE OP) Apply 1 drop to eye in the morning and at bedtime.    [provider]  TRADJENTA 5 MG TABS tablet Take 5 mg by mouth every morning. 11/16/21   [provider]    Physical Exam: Vitals:   02/04/23 1800 02/04/23 1830 02/04/23 1954 02/04/23 2005  BP: (!) 168/82 (!) 175/79 (!) 146/73   Pulse: 79 100 97   Resp: 19 20 (!) 24   Temp:   98.5 F (36.9 C)   TempSrc:   Oral   SpO2: 93% 92% 96%   Weight:    68.4 kg  Height:    5\' 3"  (1.6 m)   Constitutional: NAD, calm, comfortable, thin elderly female laying in bed and frustrated about not being able to watch TV Eyes: lids and conjunctivae normal ENMT: Mucous membranes are moist.  Neck: normal, supple Respiratory: clear to auscultation bilaterally, no wheezing, no crackles. Normal respiratory effort. No accessory muscle use.  Cardiovascular: Regular rate and rhythm, no murmurs / rubs / gallops. No extremity edema. Abdomen: Soft, non-tender, non-distended Musculoskeletal: no clubbing / cyanosis. No joint deformity upper and lower extremities. Muscle wasting on all extremities Skin: no rashes, lesions, ulcers. No induration Neurologic: CN 2-12 grossly intact.Marland Kitchen  Psychiatric: Appears to have poor insight. Alert and oriented x 3. Normal mood.   Data Reviewed:  See HPI  Assessment and Plan: * Pleural effusion -CT chest unfortunately demonstrate findings concerning for primary lung malignancy.  'Left hilar soft tissue encasing the left mainstem bronchus branches with high-grade narrowing of the left upper and left lower lobe bronchi. Findings are concerning for primary lung malignancy. 2. Moderate size left pleural effusion with consolidative changes of the majority of the left lung which may represent compressive atelectasis or pneumonia/mass. 3. 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 Multiple hepatic lesions  concerning for metastasis. -obtain diagnosis thoracentesis in the morning -continue Rocephin and doxycycline  -needs oncology consult tomorrow  HTN (hypertension) -Continue Losartan  AKI (acute kidney injury) (HCC) -Creatinine elevated at 1.69 but no prior for comparison.  -suspect AKI with finding of metabolic acidosis -has received a liter bolus -follow creatinine trend tomorrow  Metabolic acidosis -CO2 21 with anion gap of 17 -has received a liter of IV fluids  Chronic pain -continue Gabapentin and Lamictal. Follows with neurology.  Insulin dependent type 2 diabetes mellitus (HCC) -No recent A1C on record  -place on sliding scale insulin      Advance Care Planning: DNR  Consults: none  Family Communication: unable to reach pt's Goddaughter Deborah by phone-was sent to voicemail  Severity of Illness: The appropriate patient status for this patient is INPATIENT. Inpatient status is judged to be reasonable and necessary in order to provide the required intensity of service to ensure the patient's safety. The patient's presenting symptoms, physical exam findings, and initial radiographic and  laboratory data in the context of their chronic comorbidities is felt to place them at high risk for further clinical deterioration. Furthermore, it is not anticipated that the patient will be medically stable for discharge from the hospital within 2 midnights of admission.   * I certify that at the point of admission it is my clinical judgment that the patient will require inpatient hospital care spanning beyond 2 midnights from the point of admission due to high intensity of service, high risk for further deterioration and high frequency of surveillance required.*  Author: Anselm Jungling, DO 02/04/2023 10:41 PM  For on call review [email or web address].

## 2023-02-04 NOTE — Assessment & Plan Note (Signed)
Continue Losartan.

## 2023-02-04 NOTE — ED Notes (Signed)
ED TO INPATIENT HANDOFF REPORT  ED Nurse Name and Phone #: Julian Reil [phone]  S Name/Age/Gender Elizabeth Cantu 87 y.o. female Room/Bed: RESA/RESA  Code Status   Code Status: Not on file  Home/SNF/Other Home Patient oriented to: self, place, time, and situation Is this baseline? Yes   Triage Complete: Triage complete  Chief Complaint Pleural effusion [J90]  Triage Note Family states they received pt got a call from doctors office stating she has pneumonia. Pt states she has been sob for the pas three weeks. Doctor advised pt to go to ED for treatment.   Allergies Allergies  Allergen Reactions   Penicillins     Level of Care/Admitting Diagnosis ED Disposition     ED Disposition  Admit   Condition  --   Comment  Hospital Area: [facility] [[id number]]  Level of Care: Progressive [102]  Admit to Progressive based on following criteria: RESPIRATORY PROBLEMS hypoxemic/hypercapnic respiratory failure that is responsive to NIPPV (BiPAP) or High Flow Nasal Cannula (6-80 lpm). Frequent assessment/intervention, no > Q2 hrs < Q4 hrs, to maintain oxygenation and pulmonary hygiene.  May admit patient to [facility] or [facility] if equivalent level of care is available:: No  Covid Evaluation: Asymptomatic - no recent exposure (last 10 days) testing not required  Diagnosis: Pleural effusion [242230]  Admitting Physician: Anselm Jungling [[id number]]  Attending Physician: Anselm Jungling [[id number]]  Certification:: I certify this patient will need inpatient services for at least 2 midnights  Expected Medical Readiness: 02/07/2023          B Medical/Surgery History Past Medical History:  Diagnosis Date   Diabetes mellitus without complication (HCC)    Hypertension    Past Surgical History:  Procedure Laterality Date   ABDOMINAL HYSTERECTOMY     CATARACT EXTRACTION Bilateral    ORIF FOOT FRACTURE Right      A IV Location/Drains/Wounds Patient  Lines/Drains/Airways Status     Active Line/Drains/Airways     Name Placement date Placement time Site Days   Peripheral IV 02/04/23 20 G 1" Left Antecubital 02/04/23  1615  Antecubital  less than 1            Intake/Output Last 24 hours No intake or output data in the 24 hours ending 02/04/23 1937  Labs/Imaging Results for orders placed or performed during the hospital encounter of 02/04/23 (from the past 48 hour(s))  Comprehensive metabolic panel     Status: Abnormal   Collection Time: 02/04/23  4:23 PM  Result Value Ref Range   Sodium 139 135 - 145 mmol/L   Potassium 3.6 3.5 - 5.1 mmol/L   Chloride 101 98 - 111 mmol/L   CO2 21 (L) 22 - 32 mmol/L   Glucose, Bld 154 (H) 70 - 99 mg/dL    Comment: Glucose reference range applies only to samples taken after fasting for at least 8 hours.   BUN 26 (H) 8 - 23 mg/dL   Creatinine, Ser 6.29 (H) 0.44 - 1.00 mg/dL   Calcium 9.0 8.9 - 52.8 mg/dL   Total Protein 8.2 (H) 6.5 - 8.1 g/dL   Albumin 3.3 (L) 3.5 - 5.0 g/dL   AST 42 (H) 15 - 41 U/L   ALT 27 0 - 44 U/L   Alkaline Phosphatase 87 38 - 126 U/L   Total Bilirubin 0.8 0.3 - 1.2 mg/dL   GFR, Estimated 30 (L) >60 mL/min    Comment: (NOTE) Calculated using the CKD-EPI Creatinine Equation (2021)  Anion gap 17 (H) 5 - 15    Comment: Performed at [facility], [address] [place], [place] [address]  CBC with Differential     Status: Abnormal   Collection Time: 02/04/23  4:23 PM  Result Value Ref Range   WBC 10.3 4.0 - 10.5 K/uL   RBC 4.94 3.87 - 5.11 MIL/uL   Hemoglobin 14.4 12.0 - 15.0 g/dL   HCT 57.8 46.9 - 62.9 %   MCV 90.7 80.0 - 100.0 fL   MCH 29.1 26.0 - 34.0 pg   MCHC 32.1 30.0 - 36.0 g/dL   RDW 52.8 41.3 - 24.4 %   Platelets 410 (H) 150 - 400 K/uL   nRBC 0.0 0.0 - 0.2 %   Neutrophils Relative % 73 %   Neutro Abs 7.4 1.7 - 7.7 K/uL   Lymphocytes Relative 17 %   Lymphs Abs 1.8 0.7 - 4.0 K/uL   Monocytes Relative 9 %   Monocytes Absolute 0.9  0.1 - 1.0 K/uL   Eosinophils Relative 1 %   Eosinophils Absolute 0.1 0.0 - 0.5 K/uL   Basophils Relative 0 %   Basophils Absolute 0.0 0.0 - 0.1 K/uL   Immature Granulocytes 0 %   Abs Immature Granulocytes 0.03 0.00 - 0.07 K/uL    Comment: Performed at [facility], [address], [place], [place] [address]  Protime-INR     Status: None   Collection Time: 02/04/23  4:23 PM  Result Value Ref Range   Prothrombin Time 14.1 11.4 - 15.2 seconds   INR 1.1 0.8 - 1.2    Comment: (NOTE) INR goal varies based on device and disease states. Performed at [facility], [address], [place], [place] [address]   APTT     Status: None   Collection Time: 02/04/23  4:23 PM  Result Value Ref Range   aPTT 32 24 - 36 seconds    Comment: Performed at [facility], [address], [place], [place] [address]  Brain natriuretic peptide     Status: None   Collection Time: 02/04/23  4:23 PM  Result Value Ref Range   B Natriuretic Peptide 46.8 0.0 - 100.0 pg/mL    Comment: Performed at [facility], [address], [place], [place] [address]  Blood Culture (routine x 2)     Status: None (Preliminary result)   Collection Time: 02/04/23  5:05 PM   Specimen: BLOOD RIGHT ARM  Result Value Ref Range   Specimen Description      BLOOD RIGHT ARM Performed at [facility], [address], [place], [place] [address]    Special Requests      BOTTLES DRAWN AEROBIC AND ANAEROBIC Blood Culture adequate volume Performed at [facility], [address] [place], [place] [address]    Culture PENDING    Report Status PENDING   Blood Culture (routine x 2)     Status: None (Preliminary result)   Collection Time: 02/04/23  5:08 PM   Specimen: BLOOD LEFT ARM  Result Value Ref Range   Specimen Description      BLOOD LEFT ARM Performed at [facility], [address] [place], [place] [address]    Special  Requests      BOTTLES DRAWN AEROBIC AND ANAEROBIC Blood Culture results may not be optimal due to an excessive volume of blood received in culture bottles Performed at [facility], [address] [place], [place] [address]    Culture PENDING    Report Status PENDING   I-Stat Lactic Acid, ED     Status: Abnormal   Collection Time: 02/04/23  5:22 PM  Result Value Ref Range   Lactic Acid, Venous 2.5 (HH) 0.5 - 1.9 mmol/L   Comment NOTIFIED PHYSICIAN    CT Chest Wo Contrast  Result Date: 02/04/2023 CLINICAL DATA:  Pleural effusion. Malignancy suspected. Shortness of breath. EXAM: CT CHEST WITHOUT CONTRAST TECHNIQUE: Multidetector CT imaging of the chest was performed following the standard protocol without IV contrast. RADIATION DOSE REDUCTION: This exam was performed according to the departmental dose-optimization program which includes automated exposure control, adjustment of the mA and/or kV according to patient size and/or use of iterative reconstruction technique. COMPARISON:  Chest radiograph dated 02/04/2023. FINDINGS: Evaluation of this exam is limited in the absence of intravenous contrast. Cardiovascular: There is no cardiomegaly or pericardial effusion. Moderate atherosclerotic calcification of the thoracic aorta. No aneurysmal dilatation. The central pulmonary arteries are grossly unremarkable. Mediastinum/Nodes: Paratracheal adenopathy measures 12 mm in short axis. Anterior mediastinal adenopathy measures 11 mm short axis in the prevascular space. Evaluation of the left hilum is very limited due to consolidative changes of the left lung. The esophagus is grossly unremarkable. Lungs/Pleura: Left hilar soft tissue encasing the left mainstem bronchus branches. There is high-grade narrowing of the left upper and left lower lobe bronchi. There is a moderate size left pleural effusion. There is consolidative changes of the majority of the left lung which may represent  compressive atelectasis or pneumonia/mass. 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 Background of mild centrilobular emphysema. No pneumothorax. There is apparent nodularity of the pleura along the left hemidiaphragm suspicious for pleural implants. Upper Abdomen: Multiple hepatic hypodense lesions consistent with metastatic disease and measure up to 6 cm in the right lobe of the liver. Musculoskeletal: Osteopenia.  No acute osseous pathology. IMPRESSION: 1. Left hilar soft tissue encasing the left mainstem bronchus branches with high-grade narrowing of the left upper and left lower lobe bronchi. Findings are concerning for primary lung malignancy. 2. Moderate size left pleural effusion with consolidative changes of the majority of the left lung which may represent compressive atelectasis or pneumonia/mass. 3. 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 4. Mediastinal adenopathy. 5. Multiple hepatic metastatic lesions. 6. Aortic Atherosclerosis (ICD10-I70.0) and Emphysema (ICD10-J43.9). Electronically Signed   By: Elgie Collard M.D.   On: 02/04/2023 19:15   DG Chest 2 View  Result Date: 02/04/2023 CLINICAL DATA:  Shortness of breath and left pleural effusion by chest x-ray. EXAM: CHEST - 2 VIEW COMPARISON:  01/29/2023 FINDINGS: Worsening loculated left pleural fluid and ill-defined perihilar opacity in the left lung as well as volume loss of the left lung. Although this may represent progressive pneumonia with associated parapneumonic effusion or empyema, underlying malignancy is not excluded and correlation with CT of the chest is recommended. The right lung appears clear and normally aerated. No visible bony abnormalities. IMPRESSION: Worsening loculated left pleural fluid and ill-defined perihilar opacity in the left lung. Although this may represent  progressive pneumonia with associated parapneumonic effusion or empyema, underlying malignancy is not excluded and correlation with CT of the chest is recommended. Electronically Signed   By: Irish Lack M.D.   On: 02/04/2023 17:52    Pending Labs [employer] (From admission, onward)     Start  Ordered   02/04/23 1623  Urinalysis, w/ Reflex to Culture (Infection Suspected) -Urine, Clean Catch  (Undifferentiated presentation (screening labs and basic nursing orders))  ONCE - URGENT,   URGENT       Question:  Specimen Source  Answer:  Urine, Clean Catch   02/04/23 1622            Vitals/Pain Today's Vitals   02/04/23 1608 02/04/23 1725 02/04/23 1800 02/04/23 1830  BP:  (!) 144/87 (!) 168/82 (!) 175/79  Pulse:  94 79 100  Resp:  (!) 32 19 20  Temp:  98.7 F (37.1 C)    TempSrc:  Oral    SpO2:  95% 93% 92%  PainSc: 7        Isolation Precautions No active isolations  Medications Medications  albuterol (VENTOLIN HFA) 108 (90 Base) MCG/ACT inhaler 2 puff (has no administration in time range)  cefTRIAXone (ROCEPHIN) 1 g in sodium chloride 0.9 % 100 mL IVPB (0 g Intravenous Stopped 02/04/23 1843)  doxycycline (VIBRA-TABS) tablet 100 mg (100 mg Oral Given 02/04/23 1743)  sodium chloride 0.9 % bolus 1,000 mL (1,000 mLs Intravenous New Bag/Given 02/04/23 1740)    Mobility walks with person assist     Focused Assessments    R Recommendations: See Admitting Provider Note  Report given to:   Additional Notes:

## 2023-02-04 NOTE — Assessment & Plan Note (Signed)
-  Creatinine elevated at 1.69 but no prior for comparison.  -suspect AKI with finding of metabolic acidosis -has received a liter bolus -follow creatinine trend tomorrow

## 2023-02-04 NOTE — Assessment & Plan Note (Signed)
-  continue Gabapentin and Lamictal. Follows with neurology.

## 2023-02-04 NOTE — Assessment & Plan Note (Signed)
-  No recent A1C on record  -place on sliding scale insulin

## 2023-02-04 NOTE — ED Notes (Signed)
RN tried to call floor to get a purple man to give report. RN will try again.

## 2023-02-04 NOTE — Assessment & Plan Note (Addendum)
-  CT chest unfortunately demonstrate findings concerning for primary lung malignancy.  'Left hilar soft tissue encasing the left mainstem bronchus branches with high-grade narrowing of the left upper and left lower lobe bronchi. Findings are concerning for primary lung malignancy. 2. Moderate size left pleural effusion with consolidative changes of the majority of the left lung which may represent compressive atelectasis or pneumonia/mass. 3. 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 Multiple hepatic lesions concerning for metastasis. -obtain diagnosis thoracentesis in the morning -continue Rocephin and doxycycline  -needs oncology consult tomorrow

## 2023-02-04 NOTE — ED Provider Notes (Signed)
[place] EMERGENCY DEPARTMENT AT [facility] Provider Note   CSN: [id number] Arrival date & time: 02/04/23  1556     History  Chief Complaint  Patient presents with   Shortness of Breath    BROOKLINN LONGBOTTOM is a 87 y.o. female with history of type 2 diabetes, presenting from home with concern for shortness of breath and cough ongoing for 3 weeks.  Patient presents in the company of her daughter helps care for her.  She reports she has had worsening shortness of breath, also productive cough for 3 weeks.  Her doctor prescribed cough medicine but she has not started antibiotics.  There was concern for potential pneumonia.  HPI     Home Medications Prior to Admission medications   Medication Sig Start Date End Date Taking? Authorizing Provider  acetaminophen (TYLENOL) 500 MG tablet Take 500 mg by mouth every 6 (six) hours as needed.    [provider]  aspirin 81 MG tablet Take 81 mg by mouth daily.    [provider]  atorvastatin (LIPITOR) 20 MG tablet Take 20 mg by mouth daily. 10/26/21   [provider]  calcium carbonate (OS-CAL) 600 MG TABS tablet Take 600 mg by mouth daily.    [provider]  Cyanocobalamin (VITAMIN B-12 PO) Take 2,500 mcg by mouth daily.    [provider]  diltiazem (CARDIZEM CD) 300 MG 24 hr capsule Take 300 mg by mouth daily. 10/29/21   [provider]  ferrous sulfate 325 (65 FE) MG tablet Take 325 mg by mouth daily with breakfast.    [provider]  gabapentin (NEURONTIN) 300 MG capsule Take 1 capsule (300 mg total) by mouth 3 (three) times daily. 03/27/22   Sater, Pearletha Furl, MD  imipramine (TOFRANIL) 10 MG tablet Take two at bedtime 06/05/22   Sater, Pearletha Furl, MD  lamoTRIgine (LAMICTAL) 25 MG tablet For the first 5 days take 1 pill a day. Then, for the next 5 days take 1 pill twice a day.  Then increase to 2 pills twice a day thereafter 07/10/22   Sater, Pearletha Furl, MD  LANTUS SOLOSTAR  100 UNIT/ML Solostar Pen Inject 24 Units into the skin at bedtime. 12/10/21   [provider]  losartan (COZAAR) 100 MG tablet Take 100 mg by mouth daily. 10/26/21   [provider]  Menthol-Methyl Salicylate (PAIN RELIEVING) CREA Apply topically.    [provider]  pantoprazole (PROTONIX) 40 MG tablet Take 40 mg by mouth daily.    [provider]  Polyethyl Glycol-Propyl Glycol (SYSTANE FREE OP) Apply 1 drop to eye in the morning and at bedtime.    [provider]  TRADJENTA 5 MG TABS tablet Take 5 mg by mouth every morning. 11/16/21   [provider]      Allergies    Penicillins    Review of Systems   Review of Systems  Physical Exam Updated Vital Signs BP (!) 175/79   Pulse 100   Temp 98.7 F (37.1 C) (Oral)   Resp 20   SpO2 92%  Physical Exam Constitutional:      General: She is not in acute distress. HENT:     Head: Normocephalic and atraumatic.  Eyes:     Conjunctiva/sclera: Conjunctivae normal.     Pupils: Pupils are equal, round, and reactive to light.  Cardiovascular:     Rate and Rhythm: Normal rate and regular rhythm.  Pulmonary:     Effort: Pulmonary  effort is normal. No respiratory distress.     Comments: RR 30, speaking in full sentences Diminished breath sounds in left mid and lower lung Abdominal:     General: There is no distension.     Tenderness: There is no abdominal tenderness.  Skin:    General: Skin is warm and dry.  Neurological:     General: No focal deficit present.     Mental Status: She is alert. Mental status is at baseline.  Psychiatric:        Mood and Affect: Mood normal.        Behavior: Behavior normal.     ED Results / Procedures / Treatments   Labs (all labs ordered are listed, but only abnormal results are displayed) Labs Reviewed  COMPREHENSIVE METABOLIC PANEL - Abnormal; Notable for the following components:      Result Value   CO2 21 (*)    Glucose, Bld 154 (*)    BUN  26 (*)    Creatinine, Ser 1.69 (*)    Total Protein 8.2 (*)    Albumin 3.3 (*)    AST 42 (*)    GFR, Estimated 30 (*)    Anion gap 17 (*)    All other components within normal limits  CBC WITH DIFFERENTIAL/PLATELET - Abnormal; Notable for the following components:   Platelets 410 (*)    All other components within normal limits  I-STAT CG4 LACTIC ACID, ED - Abnormal; Notable for the following components:   Lactic Acid, Venous 2.5 (*)    All other components within normal limits  CULTURE, BLOOD (ROUTINE X 2)  CULTURE, BLOOD (ROUTINE X 2)  PROTIME-INR  APTT  BRAIN NATRIURETIC PEPTIDE  URINALYSIS, W/ REFLEX TO CULTURE (INFECTION SUSPECTED)  I-STAT CG4 LACTIC ACID, ED    EKG EKG Interpretation Date/Time:  Tuesday February 04 2023 16:12:42 EDT Ventricular Rate:  100 PR Interval:  130 QRS Duration:  131 QT Interval:  367 QTC Calculation: 474 R Axis:   39  Text Interpretation: Sinus tachycardia Probable left atrial enlargement Left bundle branch block Confirmed by Alvester Chou [phone]) on 02/04/2023 4:23:06 PM  Radiology DG Chest 2 View  Result Date: 02/04/2023 CLINICAL DATA:  Shortness of breath and left pleural effusion by chest x-ray. EXAM: CHEST - 2 VIEW COMPARISON:  01/29/2023 FINDINGS: Worsening loculated left pleural fluid and ill-defined perihilar opacity in the left lung as well as volume loss of the left lung. Although this may represent progressive pneumonia with associated parapneumonic effusion or empyema, underlying malignancy is not excluded and correlation with CT of the chest is recommended. The right lung appears clear and normally aerated. No visible bony abnormalities. IMPRESSION: Worsening loculated left pleural fluid and ill-defined perihilar opacity in the left lung. Although this may represent progressive pneumonia with associated parapneumonic effusion or empyema, underlying malignancy is not excluded and correlation with CT of the chest is recommended.  Electronically Signed   By: Irish Lack M.D.   On: 02/04/2023 17:52    Procedures .Critical Care  Performed by: Terald Sleeper, MD Authorized by: Terald Sleeper, MD   Critical care provider statement:    Critical care time (minutes):  35   Critical care time was exclusive of:  Separately billable procedures and treating other patients   Critical care was necessary to treat or prevent imminent or life-threatening deterioration of the following conditions:  Sepsis   Critical care was time spent personally by me on the following activities:  Ordering and  performing treatments and interventions, ordering and review of laboratory studies, ordering and review of radiographic studies, pulse oximetry, review of old charts, examination of patient and evaluation of patient's response to treatment     Medications Ordered in ED Medications  albuterol (VENTOLIN HFA) 108 (90 Base) MCG/ACT inhaler 2 puff (has no administration in time range)  cefTRIAXone (ROCEPHIN) 1 g in sodium chloride 0.9 % 100 mL IVPB (0 g Intravenous Stopped 02/04/23 1843)  doxycycline (VIBRA-TABS) tablet 100 mg (100 mg Oral Given 02/04/23 1743)  sodium chloride 0.9 % bolus 1,000 mL (1,000 mLs Intravenous New Bag/Given 02/04/23 1740)    ED Course/ Medical Decision Making/ A&P                                 Medical Decision Making Amount and/or Complexity of Data Reviewed Labs: ordered. Radiology: ordered.  Risk Prescription drug management. Decision regarding hospitalization.   This patient presents to the ED with concern for shortness of breath. This involves an extensive number of treatment options, and is a complaint that carries with it a high risk of complications and morbidity.  The differential diagnosis includes PNA vs pleural effusion vs PTX vs other  Co-morbidities that complicate the patient evaluation: Age  Additional history obtained from patient's goddaughter at bedside   I ordered and  personally interpreted labs.  The pertinent results include: White blood cell count within normal limits.  Lactate elevated 2.5.  I ordered imaging studies including x-ray of the chest I independently visualized and interpreted imaging which showed large complex left-sided pleural effusion, potential Pneumonia I agree with the radiologist interpretation  The patient was maintained on a cardiac monitor.  I personally viewed and interpreted the cardiac monitored which showed an underlying rhythm of: Sinus tachycardia and sinus rhythm  Per my interpretation the patient's ECG shows sinus tachycardia with chronic LBBB  I ordered medication including IV fluid bolus, IV Rocephin and doxycycline for pneumonia and sepsis workup  I have reviewed the patients home medicines and have made adjustments as needed  Test Considered: Lower suspicion for acute PE in this clinical setting.  After the interventions noted above, I reevaluated the patient and found that they have: stayed the same  Dispostion:  After consideration of the diagnostic results and the patients response to treatment, I feel that the patent would benefit from medical admission to the hospital.  At the time of admission I have ordered a CT scan without contrast to better evaluate this pleural effusion and for evidence of malignancy.  Given the patient's GFR of 30, I am avoiding IV contrast at this time.  Patient will likely need therapeutic and diagnostic thoracentesis, potentially with interventional radiology tomorrow.  At the time of admission she is breathing comfortably and is stable on room air at 96%.  The patient is in agreement with admission         Final Clinical Impression(s) / ED Diagnoses Final diagnoses:  Pleural effusion  Pneumonia of left lung due to infectious organism, unspecified part of lung    Rx / DC Orders ED Discharge Orders     None         Chance Karam, Kermit Balo, MD 02/04/23 1914

## 2023-02-04 NOTE — Plan of Care (Signed)
  Problem: Health Behavior/Discharge Planning: Goal: Ability to manage health-related needs will improve Outcome: Progressing   Problem: Pain Managment: Goal: General experience of comfort will improve Outcome: Progressing   

## 2023-02-04 NOTE — ED Notes (Signed)
RN called again to give report w/ no answer.

## 2023-02-05 ENCOUNTER — Inpatient Hospital Stay (HOSPITAL_COMMUNITY): Payer: Medicare PPO

## 2023-02-05 ENCOUNTER — Telehealth: Payer: Self-pay | Admitting: Emergency Medicine

## 2023-02-05 DIAGNOSIS — J90 Pleural effusion, not elsewhere classified: Secondary | ICD-10-CM | POA: Diagnosis not present

## 2023-02-05 LAB — PROTEIN, PLEURAL OR PERITONEAL FLUID: Total protein, fluid: 4.6 g/dL

## 2023-02-05 LAB — BODY FLUID CELL COUNT WITH DIFFERENTIAL
Lymphs, Fluid: 74 %
Monocyte-Macrophage-Serous Fluid: 16 % — ABNORMAL LOW (ref 50–90)
Neutrophil Count, Fluid: 10 % (ref 0–25)
Total Nucleated Cell Count, Fluid: 1105 uL — ABNORMAL HIGH (ref 0–1000)

## 2023-02-05 LAB — CBC
HCT: 39.4 % (ref 36.0–46.0)
Hemoglobin: 12.5 g/dL (ref 12.0–15.0)
MCH: 29.2 pg (ref 26.0–34.0)
MCHC: 31.7 g/dL (ref 30.0–36.0)
MCV: 92.1 fL (ref 80.0–100.0)
Platelets: 315 10*3/uL (ref 150–400)
RBC: 4.28 MIL/uL (ref 3.87–5.11)
RDW: 12.6 % (ref 11.5–15.5)
WBC: 9.8 10*3/uL (ref 4.0–10.5)
nRBC: 0 % (ref 0.0–0.2)

## 2023-02-05 LAB — LACTATE DEHYDROGENASE, PLEURAL OR PERITONEAL FLUID: LD, Fluid: 974 U/L — ABNORMAL HIGH (ref 3–23)

## 2023-02-05 LAB — BASIC METABOLIC PANEL
Anion gap: 11 (ref 5–15)
BUN: 24 mg/dL — ABNORMAL HIGH (ref 8–23)
CO2: 20 mmol/L — ABNORMAL LOW (ref 22–32)
Calcium: 8.3 mg/dL — ABNORMAL LOW (ref 8.9–10.3)
Chloride: 106 mmol/L (ref 98–111)
Creatinine, Ser: 1.52 mg/dL — ABNORMAL HIGH (ref 0.44–1.00)
GFR, Estimated: 34 mL/min — ABNORMAL LOW (ref >60)
Glucose, Bld: 122 mg/dL — ABNORMAL HIGH (ref 70–99)
Potassium: 3.6 mmol/L (ref 3.5–5.1)
Sodium: 137 mmol/L (ref 135–145)

## 2023-02-05 LAB — GLUCOSE, CAPILLARY
Glucose-Capillary: 103 mg/dL — ABNORMAL HIGH (ref 70–99)
Glucose-Capillary: 150 mg/dL — ABNORMAL HIGH (ref 70–99)
Glucose-Capillary: 95 mg/dL (ref 70–99)
Glucose-Capillary: 124 mg/dL — ABNORMAL HIGH (ref 70–99)

## 2023-02-05 LAB — GLUCOSE, PLEURAL OR PERITONEAL FLUID: Glucose, Fluid: 57 mg/dL

## 2023-02-05 LAB — LACTATE DEHYDROGENASE: LDH: 385 U/L — ABNORMAL HIGH (ref 98–192)

## 2023-02-05 LAB — LACTIC ACID, PLASMA: Lactic Acid, Venous: 1.3 mmol/L (ref 0.5–1.9)

## 2023-02-05 LAB — PROTEIN, TOTAL: Total Protein: 7.2 g/dL (ref 6.5–8.1)

## 2023-02-05 MED ORDER — BENZONATATE 100 MG PO CAPS: 200.0000 mg | ORAL_CAPSULE | Freq: Two times a day (BID) | ORAL | Status: DC | PRN

## 2023-02-05 MED ORDER — BENZONATATE 100 MG PO CAPS
200.0000 mg | ORAL_CAPSULE | Freq: Three times a day (TID) | ORAL | Status: DC
  Administered 2023-02-05 – 2023-02-08 (×11): 200 mg via ORAL
  Filled 2023-02-05 (×11): qty 2

## 2023-02-05 MED ORDER — ACETAMINOPHEN 325 MG PO TABS
650.0000 mg | ORAL_TABLET | Freq: Four times a day (QID) | ORAL | Status: DC | PRN
  Administered 2023-02-05 – 2023-02-06 (×2): 650 mg via ORAL
  Filled 2023-02-05 (×2): qty 2

## 2023-02-05 MED ORDER — LIDOCAINE HCL 1 % IJ SOLN
INTRAMUSCULAR | Status: AC
  Filled 2023-02-05: qty 20

## 2023-02-05 NOTE — TOC Initial Note (Signed)
Transition of Care [facility]) - Initial/Assessment Note    Patient Details  Name: Elizabeth Cantu MRN: [id number] Date of Birth: 08-17-1935  Transition of Care [facility]) CM/SW Contact:    Howell Rucks, RN Phone Number: 02/05/2023, 11:02 AM  Clinical Narrative:  Met with pt at bedside to introduce role of TOC/NCM and review for dc planning, Pt reports she has an established PCP and pharmacy in place, no current home care services, states she has a walker she uses as needed, pt reports she is independent with support from her god daughter, pt confirmed transportation available at discharge. TOC will continue to follow.                Expected Discharge Plan: Home/Self Care Barriers to Discharge: Continued Medical Work up   Patient Goals and CMS Choice Patient states their goals for this hospitalization and ongoing recovery are:: return home with support from family          Expected Discharge Plan and Services       Living arrangements for the past 2 months: Apartment                                      Prior Living Arrangements/Services Living arrangements for the past 2 months: Apartment Lives with:: Self Patient language and need for interpreter reviewed:: Yes        Need for Family Participation in Patient Care: Yes (Comment) Care giver support system in place?: Yes (comment) Current home services: DME (walker) Criminal Activity/Legal Involvement Pertinent to Current Situation/Hospitalization: No - Comment as needed  Activities of Daily Living   ADL Screening (condition at time of admission) Independently performs ADLs?: Yes (appropriate for developmental age) Is the patient deaf or have difficulty hearing?: No Does the patient have difficulty seeing, even when wearing glasses/contacts?: No Does the patient have difficulty concentrating, remembering, or making decisions?: No  Permission Sought/Granted                  Emotional Assessment Appearance::  Appears stated age Attitude/Demeanor/Rapport: Gracious Affect (typically observed): Accepting Orientation: : Oriented to Self, Oriented to Place, Oriented to  Time, Oriented to Situation Alcohol / Substance Use: Not Applicable Psych Involvement: No (comment)  Admission diagnosis:  Pleural effusion [J90] Pneumonia of left lung due to infectious organism, unspecified part of lung [J18.9] Patient Active Problem List   Diagnosis Date Noted   Pleural effusion 02/04/2023   Metabolic acidosis 02/04/2023   AKI (acute kidney injury) (HCC) 02/04/2023   HTN (hypertension) 02/04/2023   Lung mass 02/04/2023   Liver lesion 02/04/2023   Small fiber polyneuropathy 01/22/2022   Dysesthesia 01/22/2022   Insulin dependent type 2 diabetes mellitus (HCC) 01/22/2022   Chronic pain 01/22/2022   Mild nonproliferative diabetic retinopathy of both eyes (HCC) 03/07/2020   Posterior vitreous detachment of both eyes 03/07/2020   Pseudophakia of both eyes 03/07/2020   PCP:  Ollen Bowl, MD Pharmacy:   [facility] [phone] - [place], [place] - [address] AT [facility] [address] [place] [address] Phone: [phone] Fax: [phone]     Social Determinants of Health (SDOH) Social History: SDOH Screenings   Food Insecurity: No Food Insecurity (02/04/2023)  Housing: Low Risk  (02/04/2023)  Transportation Needs: No Transportation Needs (02/04/2023)  Utilities: Not At Risk (02/04/2023)  Tobacco Use: High Risk (02/04/2023)   SDOH Interventions:  Readmission Risk Interventions    02/05/2023   10:59 AM  Readmission Risk Prevention Plan  Post Dischage Appt Complete  Medication Screening Complete  Transportation Screening Complete

## 2023-02-05 NOTE — Plan of Care (Signed)
?  Problem: Clinical Measurements: ?Goal: Ability to maintain clinical measurements within normal limits will improve ?Outcome: Progressing ?  ?Problem: Activity: ?Goal: Risk for activity intolerance will decrease ?Outcome: Progressing ?  ?Problem: Nutrition: ?Goal: Adequate nutrition will be maintained ?Outcome: Progressing ?  ?Problem: Pain Managment: ?Goal: General experience of comfort will improve ?Outcome: Progressing ?  ?

## 2023-02-05 NOTE — Progress Notes (Signed)
SATURATION QUALIFICATIONS: (This note is used to comply with regulatory documentation for home oxygen)  Patient Saturations on Room Air at Rest = 92%  Patient Saturations on Room Air while Ambulating = 88% Patient began to labored breathing while ambulating in hall. Walked about 158ft then returned to room and applied [facility]. O2 sats checked again-93%.

## 2023-02-05 NOTE — Progress Notes (Signed)
PROGRESS NOTE    Elizabeth Cantu  HYQ:[id number] DOB: 1935-07-13 DOA: 02/04/2023 PCP: Ollen Bowl, MD   Brief Narrative: Elizabeth Cantu is a 87 y.o. female with a history of diabetes mellitus type 2, retinopathy, chronic back pain.  Patient presented secondary to shortness of breath and was found to have evidence of a left-sided loculated pleural effusion in addition to concern for pneumonia.  Imaging also concerning for possible primary lung cancer with liver metastasis.  Patient started empirically on antibiotics.  Thoracentesis ordered for fluid analysis.   Assessment and Plan:  Left-sided pleural effusion Associated shortness of breath. Noted on chest imaging.  Concern this is related to pneumonia versus probable underlying cancer.  Thoracentesis ordered and additional cell count is significant for red fluid with high LDH for consistent with exudative effusion.  Cytology ordered and is pending.  Cultures ordered and pending. -Follow-up cytology results -Follow-up culture results  Possible pneumonia Left lung consolidation. Patient started empirically on Ceftriaxone and azithromycin. -Continue Ceftriaxone and azithromycin  Primary hypertension -Continue Diltiazem  Possible lung cancer Possible metastasis to liver Noted on CT chest imaging.  Discussed with pulmonology who will set up an appointment as an outpatient for patient.  Thoracentesis is ordered with cytology ordered as well. -Follow-up cytology results  CKD stage IIIb No baseline creatinine available. Per PCP notes, patient has a history of CKD stage III. Unlikely AKI. Renal function likely stable.  Metabolic acidosis Mild.  Chronic pain -Continue gabapentin  Insulin dependent diabetes mellitus type 2 Controlled with hemoglobin A1C of 7.6%. Patient is on Lantus as an outpatient. Started on SSI. -Continue SSI  Diabetic retinopathy Noted.  Hyperlipidemia -Continue Lipitor    DVT prophylaxis: SCDs Code  Status:   Code Status: Limited: Do not attempt resuscitation (DNR) -DNR-LIMITED -Do Not Intubate/DNI  Family Communication: None at bedside Disposition Plan: Discharge home pending ability to wean off oxygen.  If cytology results are not resulted prior to discharge, patient will need close follow-up for results and referral.  Discharge home in 1 to 3 days.   Consultants:  Pulmonology (curbside)  Procedures:  None  Antimicrobials: Ceftriaxone Azithromycin    Subjective: Persistent coughing which is causing her headache and abdominal pain.  Dyspnea is stable.  Objective: BP 134/84 (BP Location: Right Arm)   Pulse 91   Temp 97.9 F (36.6 C) (Oral)   Resp 17   Ht 5\' 3"  (1.6 m)   Wt 68.4 kg   SpO2 99%   BMI 26.71 kg/m   Examination:  General exam: Appears calm and comfortable Respiratory system: Diminished respiratory effort normal. Cardiovascular system: S1 & S2 heard, RRR.  Gastrointestinal system: Abdomen is nondistended, soft and nontender. No organomegaly or masses felt. Normal bowel sounds heard. Central nervous system: Alert and oriented. No focal neurological deficits. Psychiatry: Judgement and insight appear normal. Mood & affect appropriate.    Data Reviewed: I have personally reviewed following labs and imaging studies  CBC Lab Results  Component Value Date   WBC 9.8 02/05/2023   RBC 4.28 02/05/2023   HGB 12.5 02/05/2023   HCT 39.4 02/05/2023   MCV 92.1 02/05/2023   MCH 29.2 02/05/2023   PLT 315 02/05/2023   MCHC 31.7 02/05/2023   RDW 12.6 02/05/2023   LYMPHSABS 1.8 02/04/2023   MONOABS 0.9 02/04/2023   EOSABS 0.1 02/04/2023   BASOSABS 0.0 02/04/2023     Last metabolic panel Lab Results  Component Value Date   NA 137 02/05/2023   K  3.6 02/05/2023   CL 106 02/05/2023   CO2 20 (L) 02/05/2023   BUN 24 (H) 02/05/2023   CREATININE 1.52 (H) 02/05/2023   GLUCOSE 122 (H) 02/05/2023   GFRNONAA 34 (L) 02/05/2023   CALCIUM 8.3 (L) 02/05/2023   PROT  8.2 (H) 02/04/2023   ALBUMIN 3.3 (L) 02/04/2023   LABGLOB 3.4 01/22/2022   BILITOT 0.8 02/04/2023   ALKPHOS 87 02/04/2023   AST 42 (H) 02/04/2023   ALT 27 02/04/2023   ANIONGAP 11 02/05/2023    GFR: Estimated Creatinine Clearance: 25.6 mL/min (A) (by C-G formula based on SCr of 1.52 mg/dL (H)).  Recent Results (from the past 240 hour(s))  Blood Culture (routine x 2)     Status: None (Preliminary result)   Collection Time: 02/04/23  5:05 PM   Specimen: BLOOD RIGHT ARM  Result Value Ref Range Status   Specimen Description   Final    BLOOD RIGHT ARM Performed at [facility], [address], [place], [place] [address]    Special Requests   Final    BOTTLES DRAWN AEROBIC AND ANAEROBIC Blood Culture adequate volume Performed at [facility], [address] [place], [place] [address]    Culture PENDING  Incomplete   Report Status PENDING  Incomplete  Blood Culture (routine x 2)     Status: None (Preliminary result)   Collection Time: 02/04/23  5:08 PM   Specimen: BLOOD LEFT ARM  Result Value Ref Range Status   Specimen Description   Final    BLOOD LEFT ARM Performed at [facility], [address] [place], [place] [address]    Special Requests   Final    BOTTLES DRAWN AEROBIC AND ANAEROBIC Blood Culture results may not be optimal due to an excessive volume of blood received in culture bottles Performed at [facility], [address], [place], [place] [address]    Culture PENDING  Incomplete   Report Status PENDING  Incomplete      Radiology Studies: CT Chest Wo Contrast  Result Date: 02/04/2023 CLINICAL DATA:  Pleural effusion. Malignancy suspected. Shortness of breath. EXAM: CT CHEST WITHOUT CONTRAST TECHNIQUE: Multidetector CT imaging of the chest was performed following the standard protocol without IV contrast. RADIATION DOSE REDUCTION: This exam was performed according to the departmental dose-optimization program  which includes automated exposure control, adjustment of the mA and/or kV according to patient size and/or use of iterative reconstruction technique. COMPARISON:  Chest radiograph dated 02/04/2023. FINDINGS: Evaluation of this exam is limited in the absence of intravenous contrast. Cardiovascular: There is no cardiomegaly or pericardial effusion. Moderate atherosclerotic calcification of the thoracic aorta. No aneurysmal dilatation. The central pulmonary arteries are grossly unremarkable. Mediastinum/Nodes: Paratracheal adenopathy measures 12 mm in short axis. Anterior mediastinal adenopathy measures 11 mm short axis in the prevascular space. Evaluation of the left hilum is very limited due to consolidative changes of the left lung. The esophagus is grossly unremarkable. Lungs/Pleura: Left hilar soft tissue encasing the left mainstem bronchus branches. There is high-grade narrowing of the left upper and left lower lobe bronchi. There is a moderate size left pleural effusion. There is consolidative changes of the majority of the left lung which may represent compressive atelectasis or pneumonia/mass. 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 Background of mild centrilobular emphysema. No pneumothorax. There is apparent nodularity of the pleura along the left hemidiaphragm suspicious for pleural implants. Upper Abdomen:  Multiple hepatic hypodense lesions consistent with metastatic disease and measure up to 6 cm in the right lobe of the liver. Musculoskeletal: Osteopenia.  No acute osseous pathology. IMPRESSION: 1. Left hilar soft tissue encasing the left mainstem bronchus branches with high-grade narrowing of the left upper and left lower lobe bronchi. Findings are concerning for primary lung malignancy. 2. Moderate size left pleural effusion with consolidative changes of the majority of the left lung which may represent compressive  atelectasis or pneumonia/mass. 3. 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 4. Mediastinal adenopathy. 5. Multiple hepatic metastatic lesions. 6. Aortic Atherosclerosis (ICD10-I70.0) and Emphysema (ICD10-J43.9). Electronically Signed   By: Elgie Collard M.D.   On: 02/04/2023 19:15   DG Chest 2 View  Result Date: 02/04/2023 CLINICAL DATA:  Shortness of breath and left pleural effusion by chest x-ray. EXAM: CHEST - 2 VIEW COMPARISON:  01/29/2023 FINDINGS: Worsening loculated left pleural fluid and ill-defined perihilar opacity in the left lung as well as volume loss of the left lung. Although this may represent progressive pneumonia with associated parapneumonic effusion or empyema, underlying malignancy is not excluded and correlation with CT of the chest is recommended. The right lung appears clear and normally aerated. No visible bony abnormalities. IMPRESSION: Worsening loculated left pleural fluid and ill-defined perihilar opacity in the left lung. Although this may represent progressive pneumonia with associated parapneumonic effusion or empyema, underlying malignancy is not excluded and correlation with CT of the chest is recommended. Electronically Signed   By: Irish Lack M.D.   On: 02/04/2023 17:52      LOS: 1 day    Jacquelin Hawking, MD [facility] 02/05/2023, 7:24 AM   If 7PM-7AM, please contact night-coverage [email or web address]

## 2023-02-05 NOTE — Telephone Encounter (Signed)
Patient scheduled 11/6 at 1pm with Dr. Delton Coombes. Reminder mailed to address on file. Nothing further needed.

## 2023-02-05 NOTE — Procedures (Signed)
Ultrasound-guided diagnostic and therapeutic left thoracentesis performed yielding 940 cc of dark,bloody fluid. No immediate complications. Follow-up chest x-ray pending. The fluid was sent to the lab for preordered studies. EBL < 2 cc. Due to pt coughing/chest discomfort only the above amount of fluid was removed today.

## 2023-02-05 NOTE — Telephone Encounter (Signed)
Patient needs a hospital follow-up visit with RB, BI or SG.  OK to use RB blocked slot, or nodule slot.   Thank you

## 2023-02-05 NOTE — Hospital Course (Addendum)
Elizabeth Cantu is a 87 y.o. female with a history of diabetes mellitus type 2, retinopathy, chronic back pain.  Patient presented secondary to shortness of breath and was found to have evidence of a left-sided loculated pleural effusion in addition to concern for pneumonia.  Imaging also concerning for possible primary lung cancer with liver metastasis.  Patient started empirically on antibiotics.  Thoracentesis ordered for fluid analysis and results are consistent with adenocarcinoma with concern for lung primary. Oncology consulted and decision made to transition to home with hospice.

## 2023-02-06 DIAGNOSIS — J90 Pleural effusion, not elsewhere classified: Secondary | ICD-10-CM | POA: Diagnosis not present

## 2023-02-06 DIAGNOSIS — J9601 Acute respiratory failure with hypoxia: Secondary | ICD-10-CM | POA: Diagnosis not present

## 2023-02-06 LAB — GLUCOSE, CAPILLARY
Glucose-Capillary: 132 mg/dL — ABNORMAL HIGH (ref 70–99)
Glucose-Capillary: 99 mg/dL (ref 70–99)
Glucose-Capillary: 121 mg/dL — ABNORMAL HIGH (ref 70–99)
Glucose-Capillary: 130 mg/dL — ABNORMAL HIGH (ref 70–99)

## 2023-02-06 MED ORDER — HYDROCOD POLI-CHLORPHE POLI ER 10-8 MG/5ML PO SUER
5.0000 mL | Freq: Two times a day (BID) | ORAL | Status: DC
  Administered 2023-02-06 – 2023-02-08 (×5): 5 mL via ORAL
  Filled 2023-02-06 (×5): qty 5

## 2023-02-06 MED ORDER — GUAIFENESIN-DM 100-10 MG/5ML PO SYRP
5.0000 mL | ORAL_SOLUTION | ORAL | Status: DC | PRN
  Administered 2023-02-06 – 2023-02-08 (×4): 5 mL via ORAL
  Filled 2023-02-06 (×4): qty 10

## 2023-02-06 NOTE — Plan of Care (Signed)

## 2023-02-06 NOTE — Evaluation (Signed)
Physical Therapy Evaluation Patient Details Name: Elizabeth Cantu MRN: [id number] DOB: Jun 20, 1935 Today's Date: 02/06/2023  History of Present Illness  87 y.o. female with a history of diabetes mellitus type 2, retinopathy, chronic back pain.  Patient presented secondary to shortness of breath and was found to have evidence of a left-sided loculated pleural effusion in addition to concern for pneumonia.  Imaging also concerning for possible primary lung cancer with liver metastasis.  Clinical Impression  Pt admitted with above diagnosis. Pt ambulated 140' with RW, SpO2 94% on 2L O2, 87% on room air walking, no loss of balance. Pt walks with a cane baseline, but reports she feel unsteady now. Pt was steady using RW.   Pt currently with functional limitations due to the deficits listed below (see PT Problem List). Pt will benefit from acute skilled PT to increase their independence and safety with mobility to allow discharge.           If plan is discharge home, recommend the following: A little help with bathing/dressing/bathroom;Assistance with cooking/housework;Assist for transportation;Help with stairs or ramp for entrance   Can travel by private vehicle        Equipment Recommendations Rollator (4 wheels)  Recommendations for Other Services       Functional Status Assessment Patient has had a recent decline in their functional status and demonstrates the ability to make significant improvements in function in a reasonable and predictable amount of time.     Precautions / Restrictions Precautions Precautions: Fall;Other (comment) Precaution Comments: monitor O2, denies falls in past 6 months Restrictions Weight Bearing Restrictions: No      Mobility  Bed Mobility Overal bed mobility: Modified Independent                  Transfers Overall transfer level: Modified independent                      Ambulation/Gait Ambulation/Gait assistance:  Supervision Gait Distance (Feet): 140 Feet Assistive device: Rolling walker (2 wheels) Gait Pattern/deviations: Step-through pattern Gait velocity: WFL     General Gait Details: VCs for positioning in RW, VCs pursed lip breathing, SpO2 87% room air walking, 94% on 2L O2 walking  Stairs            Wheelchair Mobility     Tilt Bed    Modified Rankin (Stroke Patients Only)       Balance Overall balance assessment: Modified Independent                                           Pertinent Vitals/Pain Pain Assessment Pain Assessment: No/denies pain    Home Living Family/patient expects to be discharged to:: Private residence Living Arrangements: Alone Available Help at Discharge: Neighbor   Home Access: Level entry       Home Layout: One level Home Equipment: Cane - single point;Shower seat;Transport chair      Prior Function Prior Level of Function : Independent/Modified Independent;Driving             Mobility Comments: walks with SPC; denied falls in past 6 month ADLs Comments: independent     Extremity/Trunk Assessment   Upper Extremity Assessment Upper Extremity Assessment: Overall WFL for tasks assessed    Lower Extremity Assessment Lower Extremity Assessment: Overall WFL for tasks assessed    Cervical / Trunk Assessment Cervical /  Trunk Assessment: Normal  Communication   Communication Communication: No apparent difficulties  Cognition Arousal: Alert Behavior During Therapy: WFL for tasks assessed/performed Overall Cognitive Status: Within Functional Limits for tasks assessed                                          General Comments      Exercises     Assessment/Plan    PT Assessment Patient needs continued PT services  PT Problem List Decreased activity tolerance;Decreased balance       PT Treatment Interventions DME instruction;Gait training;Therapeutic exercise;Functional mobility  training;Patient/family education;Therapeutic activities    PT Goals (Current goals can be found in the Care Plan section)  Acute Rehab PT Goals Patient Stated Goal: be more steady walking PT Goal Formulation: With patient Time For Goal Achievement: 02/20/23 Potential to Achieve Goals: Good    Frequency Min 1X/week     Co-evaluation               AM-PAC PT "6 Clicks" Mobility  Outcome Measure Help needed turning from your back to your side while in a flat bed without using bedrails?: None Help needed moving from lying on your back to sitting on the side of a flat bed without using bedrails?: None Help needed moving to and from a bed to a chair (including a wheelchair)?: None Help needed standing up from a chair using your arms (e.g., wheelchair or bedside chair)?: None Help needed to walk in hospital room?: A Little Help needed climbing 3-5 steps with a railing? : A Little 6 Click Score: 22    End of Session Equipment Utilized During Treatment: Gait belt;Oxygen Activity Tolerance: Patient tolerated treatment well Patient left: in chair;with call bell/phone within reach Nurse Communication: Mobility status PT Visit Diagnosis: Difficulty in walking, not elsewhere classified (R26.2);Unsteadiness on feet (R26.81)    Time: 4098-1191 PT Time Calculation (min) (ACUTE ONLY): 19 min   Charges:   PT Evaluation $PT Eval Moderate Complexity: 1 Mod   PT General Charges $$ ACUTE PT VISIT: 1 Visit        Tamala Ser PT 02/06/2023  Acute Rehabilitation Services  Office [phone]

## 2023-02-06 NOTE — Progress Notes (Signed)
SATURATION QUALIFICATIONS: (This note is used to comply with regulatory documentation for home oxygen)  Patient Saturations on Room Air at Rest = 94%  Patient Saturations on Room Air while Ambulating = 88%  Patient Saturations on 2 Liters of oxygen while Ambulating = 91%

## 2023-02-06 NOTE — Progress Notes (Signed)
PROGRESS NOTE    MARZIA TRIPPLETT  UJW:[id number] DOB: Nov 12, 1935 DOA: 02/04/2023 PCP: Ollen Bowl, MD   Brief Narrative: Elizabeth Cantu is a 87 y.o. female with a history of diabetes mellitus type 2, retinopathy, chronic back pain.  Patient presented secondary to shortness of breath and was found to have evidence of a left-sided loculated pleural effusion in addition to concern for pneumonia.  Imaging also concerning for possible primary lung cancer with liver metastasis.  Patient started empirically on antibiotics.  Thoracentesis ordered for fluid analysis.   Assessment and Plan:  Left-sided pleural effusion Associated shortness of breath. Noted on chest imaging.  Concern this is related to pneumonia versus probable underlying cancer.  Thoracentesis performed on 10/9 and cell count is significant for red fluid with high LDH for consistent with exudative effusion.  Cytology ordered and is pending.  Cultures ordered and pending (no growth to date). -Follow-up cytology results -Follow-up culture results  Acute respiratory failure with hypoxia Secondary to pleural effusion, possible pneumonia and likely cancer. -Wean to room air as able -PT/OT eval  Cough -Tussionex -Sputum culture -Continued incentive spirometer  Possible pneumonia Left lung consolidation. Patient started empirically on Ceftriaxone and azithromycin. -Continue Ceftriaxone and azithromycin  Primary hypertension -Continue Diltiazem  Possible lung cancer Possible metastasis to liver Noted on CT chest imaging.  Discussed with pulmonology who will set up an appointment as an outpatient for patient.  Thoracentesis is ordered with cytology ordered as well. -Follow-up cytology results  CKD stage IIIb No baseline creatinine available. Per PCP notes, patient has a history of CKD stage III. Unlikely AKI. Renal function likely stable.  Metabolic acidosis Mild.  Chronic pain -Continue gabapentin  Insulin  dependent diabetes mellitus type 2 Controlled with hemoglobin A1C of 7.6%. Patient is on Lantus as an outpatient. Started on SSI. -Continue SSI  Diabetic retinopathy Noted.  Hyperlipidemia -Continue Lipitor    DVT prophylaxis: SCDs Code Status:   Code Status: Limited: Do not attempt resuscitation (DNR) -DNR-LIMITED -Do Not Intubate/DNI  Family Communication: Friend at bedside Disposition Plan: Discharge home pending ability to wean off oxygen.  If cytology results are not resulted prior to discharge, patient will need close follow-up for results and referral.  Discharge home in 1 to 3 days.   Consultants:  Pulmonology (curbside)  Procedures:  None  Antimicrobials: Ceftriaxone Azithromycin    Subjective: Breathing a bit better. Continued coughing, though.  Objective: BP (!) 144/74 (BP Location: Left Arm)   Pulse 86   Temp 97.7 F (36.5 C) (Oral)   Resp 20   Ht 5\' 3"  (1.6 m)   Wt 68.4 kg   SpO2 95%   BMI 26.71 kg/m   Examination:  General exam: Appears calm and comfortable Respiratory system: Diminished. No wheezing or rales. Respiratory effort normal. Cardiovascular system: S1 & S2 heard, RRR. Gastrointestinal system: Abdomen is nondistended, soft and nontender. Normal bowel sounds heard. Central nervous system: Alert and oriented. No focal neurological deficits. Psychiatry: Judgement and insight appear normal. Mood & affect appropriate.    Data Reviewed: I have personally reviewed following labs and imaging studies  CBC Lab Results  Component Value Date   WBC 9.8 02/05/2023   RBC 4.28 02/05/2023   HGB 12.5 02/05/2023   HCT 39.4 02/05/2023   MCV 92.1 02/05/2023   MCH 29.2 02/05/2023   PLT 315 02/05/2023   MCHC 31.7 02/05/2023   RDW 12.6 02/05/2023   LYMPHSABS 1.8 02/04/2023   MONOABS 0.9 02/04/2023   EOSABS  0.1 02/04/2023   BASOSABS 0.0 02/04/2023     Last metabolic panel Lab Results  Component Value Date   NA 137 02/05/2023   K 3.6  02/05/2023   CL 106 02/05/2023   CO2 20 (L) 02/05/2023   BUN 24 (H) 02/05/2023   CREATININE 1.52 (H) 02/05/2023   GLUCOSE 122 (H) 02/05/2023   GFRNONAA 34 (L) 02/05/2023   CALCIUM 8.3 (L) 02/05/2023   PROT 7.2 02/05/2023   ALBUMIN 3.3 (L) 02/04/2023   LABGLOB 3.4 01/22/2022   BILITOT 0.8 02/04/2023   ALKPHOS 87 02/04/2023   AST 42 (H) 02/04/2023   ALT 27 02/04/2023   ANIONGAP 11 02/05/2023    GFR: Estimated Creatinine Clearance: 25.6 mL/min (A) (by C-G formula based on SCr of 1.52 mg/dL (H)).  Recent Results (from the past 240 hour(s))  Blood Culture (routine x 2)     Status: None (Preliminary result)   Collection Time: 02/04/23  5:05 PM   Specimen: BLOOD RIGHT ARM  Result Value Ref Range Status   Specimen Description   Final    BLOOD RIGHT ARM Performed at [facility], [address], [place], [place] [address]    Special Requests   Final    BOTTLES DRAWN AEROBIC AND ANAEROBIC Blood Culture adequate volume Performed at [facility], [address], [place], [place] [address]    Culture   Final    NO GROWTH < 24 HOURS Performed at [facility], [address], [place], [place] [address]    Report Status PENDING  Incomplete  Blood Culture (routine x 2)     Status: None (Preliminary result)   Collection Time: 02/04/23  5:08 PM   Specimen: BLOOD LEFT ARM  Result Value Ref Range Status   Specimen Description   Final    BLOOD LEFT ARM Performed at [facility], [address] [place], [place] [address]    Special Requests   Final    BOTTLES DRAWN AEROBIC AND ANAEROBIC Blood Culture results may not be optimal due to an excessive volume of blood received in culture bottles Performed at [facility], [address] [place], [place] [address]    Culture   Final    NO GROWTH < 24 HOURS Performed at [facility], [address] [place], [place] [address]    Report Status PENDING  Incomplete  Body fluid  culture w Gram Stain     Status: None (Preliminary result)   Collection Time: 02/05/23 11:52 AM   Specimen: A: Pleural, Left; Pleural Fluid   B: Pleural, Left; Body Fluid  Result Value Ref Range Status   Specimen Description   Final    PLEURAL LT Performed at [facility], [address] [place], [place] [address]    Special Requests   Final    Normal Performed at [facility], [address] [place], [place] [address]    Gram Stain NO WBC SEEN NO ORGANISMS SEEN   Final   Culture   Final    NO GROWTH < 24 HOURS Performed at [facility], [address] [place], [place] [address]    Report Status PENDING  Incomplete      Radiology Studies: DG Chest 1 View  Result Date: 02/05/2023 CLINICAL DATA:  Thoracentesis. EXAM: CHEST  1 VIEW COMPARISON:  02/04/2023 and CT chest 02/04/2023. FINDINGS: Patient is slightly rotated. Trachea is midline. Thoracic aorta is calcified.  Heart size is grossly stable. Septal thickening and airspace consolidation in the left lung with a large loculated left pleural effusion, slightly decreased in yesterday. Minimal right basilar atelectasis. IMPRESSION: Slight decrease in size of a large loculated left pleural effusion with septal thickening and airspace consolidation in the left lung, findings indicative of malignancy. Electronically Signed   By: Leanna Battles M.D.   On: 02/05/2023 13:59   US THORACENTESIS ASP PLEURAL SPACE W/IMG GUIDE  Result Date: 02/05/2023 INDICATION: Patient with history of prior tobacco use, dyspnea, cough; left hilar soft tissue mass, liver lesions and adenopathy along with left pleural effusion noted on recent imaging. Request received for diagnostic and therapeutic left thoracentesis. EXAM: ULTRASOUND GUIDED DIAGNOSTIC AND THERAPEUTIC LEFT THORACENTESIS MEDICATIONS: 8 mL 1% lidocaine COMPLICATIONS: None immediate. PROCEDURE: An ultrasound guided thoracentesis was thoroughly discussed  with the patient and questions answered. The benefits, risks, alternatives and complications were also discussed. The patient understands and wishes to proceed with the procedure. Written consent was obtained. Ultrasound was performed to localize and mark an adequate pocket of fluid in the left chest. The area was then prepped and draped in the normal sterile fashion. 1% Lidocaine was used for local anesthesia. Under ultrasound guidance a 6 Fr Safe-T-Centesis catheter was introduced. Thoracentesis was performed. The catheter was removed and a dressing applied. FINDINGS: A total of approximately 940 cc of dark, bloody fluid was removed. Samples were sent to the laboratory as requested by the clinical team. Due to patient coughing and chest discomfort only the above amount of fluid was removed today. IMPRESSION: Successful ultrasound guided diagnostic and therapeutic left thoracentesis yielding 940 cc of pleural fluid. Performed by: Artemio Aly Electronically Signed   By: Richarda Overlie M.D.   On: 02/05/2023 12:37   CT Chest Wo Contrast  Result Date: 02/04/2023 CLINICAL DATA:  Pleural effusion. Malignancy suspected. Shortness of breath. EXAM: CT CHEST WITHOUT CONTRAST TECHNIQUE: Multidetector CT imaging of the chest was performed following the standard protocol without IV contrast. RADIATION DOSE REDUCTION: This exam was performed according to the departmental dose-optimization program which includes automated exposure control, adjustment of the mA and/or kV according to patient size and/or use of iterative reconstruction technique. COMPARISON:  Chest radiograph dated 02/04/2023. FINDINGS: Evaluation of this exam is limited in the absence of intravenous contrast. Cardiovascular: There is no cardiomegaly or pericardial effusion. Moderate atherosclerotic calcification of the thoracic aorta. No aneurysmal dilatation. The central pulmonary arteries are grossly unremarkable. Mediastinum/Nodes: Paratracheal adenopathy  measures 12 mm in short axis. Anterior mediastinal adenopathy measures 11 mm short axis in the prevascular space. Evaluation of the left hilum is very limited due to consolidative changes of the left lung. The esophagus is grossly unremarkable. Lungs/Pleura: Left hilar soft tissue encasing the left mainstem bronchus branches. There is high-grade narrowing of the left upper and left lower lobe bronchi. There is a moderate size left pleural effusion. There is consolidative changes of the majority of the left lung which may represent compressive atelectasis or pneumonia/mass. 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 Background of mild centrilobular emphysema. No pneumothorax. There is apparent nodularity of the pleura along the left hemidiaphragm suspicious for pleural implants. Upper Abdomen: Multiple hepatic hypodense lesions consistent with metastatic disease and measure up to 6 cm in the right lobe of the liver. Musculoskeletal: Osteopenia.  No acute osseous pathology. IMPRESSION: 1. Left hilar soft tissue encasing the left mainstem bronchus branches with high-grade narrowing of the left  upper and left lower lobe bronchi. Findings are concerning for primary lung malignancy. 2. Moderate size left pleural effusion with consolidative changes of the majority of the left lung which may represent compressive atelectasis or pneumonia/mass. 3. 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 4. Mediastinal adenopathy. 5. Multiple hepatic metastatic lesions. 6. Aortic Atherosclerosis (ICD10-I70.0) and Emphysema (ICD10-J43.9). Electronically Signed   By: Elgie Collard M.D.   On: 02/04/2023 19:15   DG Chest 2 View  Result Date: 02/04/2023 CLINICAL DATA:  Shortness of breath and left pleural effusion by chest x-ray. EXAM: CHEST - 2 VIEW COMPARISON:  01/29/2023 FINDINGS:  Worsening loculated left pleural fluid and ill-defined perihilar opacity in the left lung as well as volume loss of the left lung. Although this may represent progressive pneumonia with associated parapneumonic effusion or empyema, underlying malignancy is not excluded and correlation with CT of the chest is recommended. The right lung appears clear and normally aerated. No visible bony abnormalities. IMPRESSION: Worsening loculated left pleural fluid and ill-defined perihilar opacity in the left lung. Although this may represent progressive pneumonia with associated parapneumonic effusion or empyema, underlying malignancy is not excluded and correlation with CT of the chest is recommended. Electronically Signed   By: Irish Lack M.D.   On: 02/04/2023 17:52      LOS: 2 days    Jacquelin Hawking, MD [facility] 02/06/2023, 12:44 PM   If 7PM-7AM, please contact night-coverage [email or web address]

## 2023-02-07 DIAGNOSIS — J9601 Acute respiratory failure with hypoxia: Secondary | ICD-10-CM | POA: Diagnosis not present

## 2023-02-07 DIAGNOSIS — J90 Pleural effusion, not elsewhere classified: Secondary | ICD-10-CM | POA: Diagnosis not present

## 2023-02-07 LAB — BASIC METABOLIC PANEL
Anion gap: 10 (ref 5–15)
BUN: 18 mg/dL (ref 8–23)
CO2: 24 mmol/L (ref 22–32)
Calcium: 8.4 mg/dL — ABNORMAL LOW (ref 8.9–10.3)
Chloride: 106 mmol/L (ref 98–111)
Creatinine, Ser: 1.27 mg/dL — ABNORMAL HIGH (ref 0.44–1.00)
GFR, Estimated: 42 mL/min — ABNORMAL LOW (ref >60)
Glucose, Bld: 143 mg/dL — ABNORMAL HIGH (ref 70–99)
Potassium: 3.8 mmol/L (ref 3.5–5.1)
Sodium: 140 mmol/L (ref 135–145)

## 2023-02-07 LAB — CBC
HCT: 36.8 % (ref 36.0–46.0)
Hemoglobin: 11.7 g/dL — ABNORMAL LOW (ref 12.0–15.0)
MCH: 29 pg (ref 26.0–34.0)
MCHC: 31.8 g/dL (ref 30.0–36.0)
MCV: 91.3 fL (ref 80.0–100.0)
Platelets: 286 10*3/uL (ref 150–400)
RBC: 4.03 MIL/uL (ref 3.87–5.11)
RDW: 12.5 % (ref 11.5–15.5)
WBC: 8.5 10*3/uL (ref 4.0–10.5)
nRBC: 0 % (ref 0.0–0.2)

## 2023-02-07 LAB — GLUCOSE, CAPILLARY
Glucose-Capillary: 125 mg/dL — ABNORMAL HIGH (ref 70–99)
Glucose-Capillary: 157 mg/dL — ABNORMAL HIGH (ref 70–99)
Glucose-Capillary: 122 mg/dL — ABNORMAL HIGH (ref 70–99)
Glucose-Capillary: 186 mg/dL — ABNORMAL HIGH (ref 70–99)
Glucose-Capillary: 93 mg/dL (ref 70–99)

## 2023-02-07 LAB — CYTOLOGY - NON PAP

## 2023-02-07 MED ORDER — ORAL CARE MOUTH RINSE: 15.0000 mL | OROMUCOSAL | Status: DC | PRN

## 2023-02-07 NOTE — Progress Notes (Signed)
Physical Therapy Treatment Patient Details Name: Elizabeth Cantu MRN: [id number] DOB: Jan 31, 1936 Today's Date: 02/07/2023   History of Present Illness 87 y.o. female with a history of diabetes mellitus type 2, retinopathy, chronic back pain.  Patient presented secondary to shortness of breath and was found to have evidence of a left-sided loculated pleural effusion in addition to concern for pneumonia.  Imaging also concerning for possible primary lung cancer with liver metastasis.    PT Comments  Pt is progressing well with mobility, she ambulated  160' with RW, no loss of balance, SpO2 93% on room air with ambulation.     If plan is discharge home, recommend the following: A little help with bathing/dressing/bathroom;Assistance with cooking/housework;Assist for transportation;Help with stairs or ramp for entrance   Can travel by private vehicle        Equipment Recommendations  Rollator (4 wheels)    Recommendations for Other Services       Precautions / Restrictions Precautions Precautions: Fall;Other (comment) Precaution Comments: monitor O2, denies falls in past 6 months Restrictions Weight Bearing Restrictions: No     Mobility  Bed Mobility               General bed mobility comments: up in recliner    Transfers Overall transfer level: Needs assistance Equipment used: None Transfers: Sit to/from Stand Sit to Stand: Supervision   Step pivot transfers: Contact guard assist       General transfer comment: VCs hand placement    Ambulation/Gait Ambulation/Gait assistance: Supervision Gait Distance (Feet): 160 Feet Assistive device: Rolling walker (2 wheels) Gait Pattern/deviations: Step-through pattern, Trunk flexed Gait velocity: WFL     General Gait Details: VCs for positioning in RW, VCs pursed lip breathing, SpO2 93% on room air walking, no loss of balance   Stairs             Wheelchair Mobility     Tilt Bed    Modified Rankin  (Stroke Patients Only)       Balance Overall balance assessment: Mild deficits observed, not formally tested, Needs assistance Sitting-balance support: No upper extremity supported, Feet supported Sitting balance-Leahy Scale: Good     Standing balance support: During functional activity, Single extremity supported Standing balance-Leahy Scale: Good                              Cognition Arousal: Alert Behavior During Therapy: WFL for tasks assessed/performed Overall Cognitive Status: Within Functional Limits for tasks assessed                                          Exercises      General Comments        Pertinent Vitals/Pain Pain Assessment Pain Assessment: 0-10 Pain Score: 7  Pain Location: abdomen "from coughing" Pain Descriptors / Indicators: Aching Pain Intervention(s): Limited activity within patient's tolerance, Monitored during session, Repositioned (pt declined pain medication)    Home Living Family/patient expects to be discharged to:: Private residence Living Arrangements: Alone Available Help at Discharge: Neighbor Type of Home: Apartment Home Access: Level entry       Home Layout: One level Home Equipment: Cane - single point;Shower seat;Transport chair      Prior Function            PT Goals (current goals can now be found  in the care plan section) Acute Rehab PT Goals Patient Stated Goal: be more steady walking PT Goal Formulation: With patient Time For Goal Achievement: 02/20/23 Potential to Achieve Goals: Good Progress towards PT goals: Progressing toward goals    Frequency    Min 1X/week      PT Plan      Co-evaluation              AM-PAC PT "6 Clicks" Mobility   Outcome Measure  Help needed turning from your back to your side while in a flat bed without using bedrails?: None Help needed moving from lying on your back to sitting on the side of a flat bed without using bedrails?:  None Help needed moving to and from a bed to a chair (including a wheelchair)?: None Help needed standing up from a chair using your arms (e.g., wheelchair or bedside chair)?: None Help needed to walk in hospital room?: A Little Help needed climbing 3-5 steps with a railing? : A Little 6 Click Score: 22    End of Session Equipment Utilized During Treatment: Gait belt Activity Tolerance: Patient tolerated treatment well Patient left: in chair;with call bell/phone within reach;with family/visitor present Nurse Communication: Mobility status PT Visit Diagnosis: Difficulty in walking, not elsewhere classified (R26.2);Unsteadiness on feet (R26.81)     Time: 8295-6213 PT Time Calculation (min) (ACUTE ONLY): 15 min  Charges:    $Gait Training: 8-22 mins PT General Charges $$ ACUTE PT VISIT: 1 Visit                     Tamala Ser PT 02/07/2023  Acute Rehabilitation Services  Office [phone]

## 2023-02-07 NOTE — Progress Notes (Signed)
PROGRESS NOTE    Elizabeth Cantu  EXB:[id number] DOB: 07-03-1935 DOA: 02/04/2023 PCP: Ollen Bowl, MD   Brief Narrative: Elizabeth Cantu is a 87 y.o. female with a history of diabetes mellitus type 2, retinopathy, chronic back pain.  Patient presented secondary to shortness of breath and was found to have evidence of a left-sided loculated pleural effusion in addition to concern for pneumonia.  Imaging also concerning for possible primary lung cancer with liver metastasis.  Patient started empirically on antibiotics.  Thoracentesis ordered for fluid analysis.   Assessment and Plan:  Left-sided pleural effusion Associated shortness of breath. Noted on chest imaging.  Concern this is related to pneumonia versus probable underlying cancer.  Thoracentesis performed on 10/9 and cell count is significant for red fluid with high LDH for consistent with exudative effusion.  Cytology ordered and is pending.  Cultures ordered and pending (no growth to date). -Follow-up cytology results -Follow-up culture results  Acute respiratory failure with hypoxia Secondary to pleural effusion, possible pneumonia and likely cancer. PT recommending home health services. -Wean to room air as able  Cough -Tussionex -Sputum culture -Continued incentive spirometer  Possible pneumonia Left lung consolidation. Patient started empirically on Ceftriaxone and azithromycin. -Continue Ceftriaxone and azithromycin  Primary hypertension -Continue Diltiazem  Possible lung cancer Possible metastasis to liver Noted on CT chest imaging.  Discussed with pulmonology who will set up an appointment as an outpatient for patient.  Thoracentesis is ordered with cytology ordered as well. -Follow-up cytology results  CKD stage IIIb No baseline creatinine available. Per PCP notes, patient has a history of CKD stage III. Unlikely AKI. Renal function likely stable.  Metabolic acidosis Mild.  Chronic pain -Continue  gabapentin  Insulin dependent diabetes mellitus type 2 Controlled with hemoglobin A1C of 7.6%. Patient is on Lantus as an outpatient. Started on SSI. -Continue SSI  Diabetic retinopathy Noted.  Hyperlipidemia -Continue Lipitor    DVT prophylaxis: SCDs Code Status:   Code Status: Limited: Do not attempt resuscitation (DNR) -DNR-LIMITED -Do Not Intubate/DNI  Family Communication: None at bedside Disposition Plan: Discharge home pending ability to wean off oxygen.  If cytology results are not resulted prior to discharge, patient will need close follow-up for results and referral.  Discharge home in 1 to 2 days.   Consultants:  Pulmonology (curbside)  Procedures:  None  Antimicrobials: Ceftriaxone Azithromycin    Subjective: Coughing is improving. Still requiring oxygen.  Objective: BP (!) 157/72   Pulse 93   Temp 97.7 F (36.5 C) (Oral)   Resp 20   Ht 5\' 3"  (1.6 m)   Wt 68.4 kg   SpO2 96%   BMI 26.71 kg/m   Examination:  General exam: Appears calm and comfortable Respiratory system: Diminished to auscultation. Respiratory effort normal. Cardiovascular system: S1 & S2 heard, RRR. Gastrointestinal system: Abdomen is nondistended, soft and nontender. Normal bowel sounds heard. Central nervous system: Alert and oriented. No focal neurological deficits. Psychiatry: Judgement and insight appear normal. Mood & affect appropriate.    Data Reviewed: I have personally reviewed following labs and imaging studies  CBC Lab Results  Component Value Date   WBC 8.5 02/07/2023   RBC 4.03 02/07/2023   HGB 11.7 (L) 02/07/2023   HCT 36.8 02/07/2023   MCV 91.3 02/07/2023   MCH 29.0 02/07/2023   PLT 286 02/07/2023   MCHC 31.8 02/07/2023   RDW 12.5 02/07/2023   LYMPHSABS 1.8 02/04/2023   MONOABS 0.9 02/04/2023   EOSABS 0.1 02/04/2023  BASOSABS 0.0 02/04/2023     Last metabolic panel Lab Results  Component Value Date   NA 140 02/07/2023   K 3.8 02/07/2023   CL  106 02/07/2023   CO2 24 02/07/2023   BUN 18 02/07/2023   CREATININE 1.27 (H) 02/07/2023   GLUCOSE 143 (H) 02/07/2023   GFRNONAA 42 (L) 02/07/2023   CALCIUM 8.4 (L) 02/07/2023   PROT 7.2 02/05/2023   ALBUMIN 3.3 (L) 02/04/2023   LABGLOB 3.4 01/22/2022   BILITOT 0.8 02/04/2023   ALKPHOS 87 02/04/2023   AST 42 (H) 02/04/2023   ALT 27 02/04/2023   ANIONGAP 10 02/07/2023    GFR: Estimated Creatinine Clearance: 30.6 mL/min (A) (by C-G formula based on SCr of 1.27 mg/dL (H)).  Recent Results (from the past 240 hour(s))  Blood Culture (routine x 2)     Status: None (Preliminary result)   Collection Time: 02/04/23  5:05 PM   Specimen: BLOOD RIGHT ARM  Result Value Ref Range Status   Specimen Description   Final    BLOOD RIGHT ARM Performed at [facility], [address], [place], [place] [address]    Special Requests   Final    BOTTLES DRAWN AEROBIC AND ANAEROBIC Blood Culture adequate volume Performed at [facility], [address], [place], [place] [address]    Culture   Final    NO GROWTH 2 DAYS Performed at [facility], [address], [place], [place] [address]    Report Status PENDING  Incomplete  Blood Culture (routine x 2)     Status: None (Preliminary result)   Collection Time: 02/04/23  5:08 PM   Specimen: BLOOD LEFT ARM  Result Value Ref Range Status   Specimen Description   Final    BLOOD LEFT ARM Performed at [facility], [address], [place], [place] [address]    Special Requests   Final    BOTTLES DRAWN AEROBIC AND ANAEROBIC Blood Culture results may not be optimal due to an excessive volume of blood received in culture bottles Performed at [facility], [address], [place], [place] [address]    Culture   Final    NO GROWTH 2 DAYS Performed at [facility], [address] [place], [place] [address]    Report Status PENDING  Incomplete  Body fluid culture w Gram Stain     Status: None  (Preliminary result)   Collection Time: 02/05/23 11:52 AM   Specimen: A: Pleural, Left; Pleural Fluid   B: Pleural, Left; Body Fluid  Result Value Ref Range Status   Specimen Description   Final    PLEURAL LT Performed at [facility], [address], [place], [place] [address]    Special Requests   Final    Normal Performed at [facility], [address], [place], [place] [address]    Gram Stain NO WBC SEEN NO ORGANISMS SEEN   Final   Culture   Final    NO GROWTH < 24 HOURS Performed at [facility], [address], [place], [place] [address]    Report Status PENDING  Incomplete      Radiology Studies: DG Chest 1 View  Result Date: 02/05/2023 CLINICAL DATA:  Thoracentesis. EXAM: CHEST  1 VIEW COMPARISON:  02/04/2023 and CT chest 02/04/2023. FINDINGS: Patient is slightly rotated. Trachea is midline. Thoracic aorta is calcified. Heart size is grossly stable. Septal thickening and  airspace consolidation in the left lung with a large loculated left pleural effusion, slightly decreased in yesterday. Minimal right basilar atelectasis. IMPRESSION: Slight decrease in size of a large loculated left pleural effusion with septal thickening and airspace consolidation in the left lung, findings indicative of malignancy. Electronically Signed   By: Leanna Battles M.D.   On: 02/05/2023 13:59   US THORACENTESIS ASP PLEURAL SPACE W/IMG GUIDE  Result Date: 02/05/2023 INDICATION: Patient with history of prior tobacco use, dyspnea, cough; left hilar soft tissue mass, liver lesions and adenopathy along with left pleural effusion noted on recent imaging. Request received for diagnostic and therapeutic left thoracentesis. EXAM: ULTRASOUND GUIDED DIAGNOSTIC AND THERAPEUTIC LEFT THORACENTESIS MEDICATIONS: 8 mL 1% lidocaine COMPLICATIONS: None immediate. PROCEDURE: An ultrasound guided thoracentesis was thoroughly discussed with the patient and questions answered.  The benefits, risks, alternatives and complications were also discussed. The patient understands and wishes to proceed with the procedure. Written consent was obtained. Ultrasound was performed to localize and mark an adequate pocket of fluid in the left chest. The area was then prepped and draped in the normal sterile fashion. 1% Lidocaine was used for local anesthesia. Under ultrasound guidance a 6 Fr Safe-T-Centesis catheter was introduced. Thoracentesis was performed. The catheter was removed and a dressing applied. FINDINGS: A total of approximately 940 cc of dark, bloody fluid was removed. Samples were sent to the laboratory as requested by the clinical team. Due to patient coughing and chest discomfort only the above amount of fluid was removed today. IMPRESSION: Successful ultrasound guided diagnostic and therapeutic left thoracentesis yielding 940 cc of pleural fluid. Performed by: Artemio Aly Electronically Signed   By: Richarda Overlie M.D.   On: 02/05/2023 12:37      LOS: 3 days    Jacquelin Hawking, MD [facility] 02/07/2023, 10:47 AM   If 7PM-7AM, please contact night-coverage [email or web address]

## 2023-02-07 NOTE — TOC Progression Note (Signed)
Transition of Care [facility]) - Progression Note    Patient Details  Name: Elizabeth Cantu MRN: [id number] Date of Birth: [birth year]  Transition of [facility] [place] CM/SW Contact  Howell Rucks, RN Phone Number: 02/07/2023, 11:22 AM  Clinical Narrative:  PT eval completed, recommendation for [facility] PT, pt agreeable, no preference. Pt reports she has a walker and rollator at home. [facility] rep-Amy, accepted for [facility] PT, added to AVS     Expected Discharge Plan: Home/Self Care Barriers to Discharge: Continued Medical Work up  Expected Discharge Plan and Services       Living arrangements for the past 2 months: Apartment                                       Social Determinants of Health (SDOH) Interventions SDOH Screenings   Food Insecurity: No Food Insecurity (02/04/2023)  Housing: Low Risk  (02/04/2023)  Transportation Needs: No Transportation Needs (02/04/2023)  Utilities: Not At Risk (02/04/2023)  Tobacco Use: High Risk (02/04/2023)    Readmission Risk Interventions    02/05/2023   10:59 AM  Readmission Risk Prevention Plan  Post Dischage Appt Complete  Medication Screening Complete  Transportation Screening Complete

## 2023-02-07 NOTE — Evaluation (Signed)
Occupational Therapy Evaluation Patient Details Name: Elizabeth Cantu MRN: [id number] DOB: [birth year] Today's Date: 02/07/2023   History of Present Illness 87 y.o. female with a history of diabetes mellitus type 2, retinopathy, chronic back pain.  Patient presented secondary to shortness of breath and was found to have evidence of a left-sided loculated pleural effusion in addition to concern for pneumonia.  Imaging also concerning for possible primary lung cancer with liver metastasis.   Clinical Impression   Pt received in bed, attempting to climb over bed rail to use BSC.  Pt pleasant and eager to return to PLOF. PTA pt reports living alone in an apt with level entry, has shower seat in tub, using SPC and has neighbors/family for assistance. Denies recent falls history.   Pt with impulsive tendancies; completes bed mobility MOD I, dons socks sitting EOB with setup no LOB, STS transfers with CGA-supervision, transfers using HHA to [facility] with MIN A, and functional mobility in room/hallway ~200 ft with RW, no LOB, on 3L O2 via [place]. With activity, Spo2 decreasing to 88%, returns to 94% after mobility with breathing techniques. Pt presents to acute OT demonstrating impaired ADL performance and functional mobility 2/2 generalized weakness, decreased tolerance to activity and impaired balance (See OT problem list for additional functional deficits). Pt would benefit from skilled OT services to address noted impairments and functional limitations (see below for any additional details) in order to maximize safety and independence while minimizing falls risk and caregiver burden. Anticipate the need for follow up OT services upon acute hospital DC. RN present in room for medications. Pt left in recliner, needs within reach, SCDs reapplied and chair alarm on. OT will continue to follow to maximize functional gains.       If plan is discharge home, recommend the following: Direct supervision/assist for financial  management;Direct supervision/assist for medications management;Assist for transportation    Functional Status Assessment  Patient has had a recent decline in their functional status and demonstrates the ability to make significant improvements in function in a reasonable and predictable amount of time.  Equipment Recommendations  None recommended by OT    Recommendations for Other Services Other (comment)     Precautions / Restrictions Precautions Precautions: Fall;Other (comment) Precaution Comments: monitor O2, denies falls in past 6 months Restrictions Weight Bearing Restrictions: No      Mobility Bed Mobility Overal bed mobility: Modified Independent             General bed mobility comments: Pt recieved trying to climb OOB over bed rails to use BSC    Transfers Overall transfer level: Needs assistance Equipment used: 1 person hand held assist Transfers: Bed to chair/wheelchair/BSC       Step pivot transfers: Contact guard assist            Balance Overall balance assessment: Mild deficits observed, not formally tested, Needs assistance Sitting-balance support: No upper extremity supported, Feet supported Sitting balance-Leahy Scale: Good     Standing balance support: During functional activity, Single extremity supported Standing balance-Leahy Scale: Good                             ADL either performed or assessed with clinical judgement   ADL Overall ADL's : Needs assistance/impaired Eating/Feeding: Set up;Sitting                       Toilet Transfer: Minimal assistance;Stand-pivot;BSC/3in1 Statistician Details (  indicate cue type and reason): HHA Toileting- Clothing Manipulation and Hygiene: Contact guard assist;Sit to/from stand       Functional mobility during ADLs: Contact guard assist;Rolling walker (2 wheels) General ADL Comments: Pt with impulsive tendancies; completes bed mobility MOD I, STS transfers with  CGA-supervision, transfers using HHA to [facility] with MIN A, and functional mobility in rom/hallway ~200 ft with RW, no LOB, on 3L O2 via [place]. Will need setup for ADLs and supervision for med mgmt.     Vision Baseline Vision/History: 0 No visual deficits       Perception         Praxis         Pertinent Vitals/Pain Pain Assessment Pain Assessment: No/denies pain     Extremity/Trunk Assessment Upper Extremity Assessment Upper Extremity Assessment: Generalized weakness;Right hand dominant   Lower Extremity Assessment Lower Extremity Assessment: Defer to PT evaluation;Overall WFL for tasks assessed       Communication Communication Communication: No apparent difficulties   Cognition Arousal: Alert Behavior During Therapy: Impulsive Overall Cognitive Status: Within Functional Limits for tasks assessed                                                  Home Living Family/patient expects to be discharged to:: Private residence Living Arrangements: Alone Available Help at Discharge: Neighbor Type of Home: Apartment Home Access: Level entry     Home Layout: One level     Bathroom Shower/Tub: Chief Strategy Officer: Standard Bathroom Accessibility: Yes   Home Equipment: Cane - single point;Shower seat;Transport chair          Prior Functioning/Environment Prior Level of Function : Independent/Modified Independent;Driving             Mobility Comments: walks with SPC; denied falls in past 6 month ADLs Comments: independent        OT Problem List: Decreased strength;Decreased activity tolerance;Impaired balance (sitting and/or standing);Decreased safety awareness;Decreased knowledge of use of DME or AE      OT Treatment/Interventions: Self-care/ADL training;Therapeutic exercise;Energy conservation;DME and/or AE instruction;Therapeutic activities;Patient/family education;Balance training    OT Goals(Current goals can be found in  the care plan section) Acute Rehab OT Goals OT Goal Formulation: With patient Time For Goal Achievement: 02/21/23 Potential to Achieve Goals: Good  OT Frequency: Min 1X/week    AM-PAC OT "6 Clicks" Daily Activity     Outcome Measure Help from another person eating meals?: None Help from another person taking care of personal grooming?: None Help from another person toileting, which includes using toliet, bedpan, or urinal?: A Little Help from another person bathing (including washing, rinsing, drying)?: A Little Help from another person to put on and taking off regular upper body clothing?: None Help from another person to put on and taking off regular lower body clothing?: A Little 6 Click Score: 21   End of Session Equipment Utilized During Treatment: Gait belt;Rolling walker (2 wheels);Oxygen Nurse Communication: Mobility status  Activity Tolerance: Patient tolerated treatment well Patient left: in chair;with call bell/phone within reach;with chair alarm set;with SCD's reapplied  OT Visit Diagnosis: Unsteadiness on feet (R26.81);Muscle weakness (generalized) (M62.81);Other abnormalities of gait and mobility (R26.89)                Time: 4098-1191 OT Time Calculation (min): 42 min Charges:  OT General Charges $OT  Visit: 1 Visit OT Evaluation $OT Eval Low Complexity: 1 Low OT Treatments $Self Care/Home Management : 8-22 mins $Therapeutic Activity: 8-22 mins  Athalee Esterline L. Yukari Flax, OTR/L  02/07/23, 12:12 PM

## 2023-02-07 NOTE — Progress Notes (Signed)
Mobility Specialist - Progress Note   02/07/23 1349  Mobility  Activity Ambulated with assistance in hallway  Level of Assistance Standby assist, set-up cues, supervision of patient - no hands on  Assistive Device Front wheel walker  Distance Ambulated (ft) 160 ft  Activity Response Tolerated well  Mobility Referral Yes  $Mobility charge 1 Mobility  Mobility Specialist Start Time (ACUTE ONLY) 0136  Mobility Specialist Stop Time (ACUTE ONLY) 0149  Mobility Specialist Time Calculation (min) (ACUTE ONLY) 13 min   Pt received in recliner and agreeable to mobility. No complaints during session. Pt to bed after session with all needs met. Bed alarm on.     [facility]

## 2023-02-08 DIAGNOSIS — J90 Pleural effusion, not elsewhere classified: Secondary | ICD-10-CM | POA: Diagnosis not present

## 2023-02-08 LAB — GLUCOSE, CAPILLARY
Glucose-Capillary: 121 mg/dL — ABNORMAL HIGH (ref 70–99)
Glucose-Capillary: 170 mg/dL — ABNORMAL HIGH (ref 70–99)

## 2023-02-08 LAB — BODY FLUID CULTURE W GRAM STAIN
Culture: NO GROWTH
Gram Stain: NONE SEEN
Special Requests: NORMAL

## 2023-02-08 MED ORDER — HYDROCOD POLI-CHLORPHE POLI ER 10-8 MG/5ML PO SUER: 5.0000 mL | Freq: Two times a day (BID) | ORAL | 0 refills | Status: AC | PRN

## 2023-02-08 MED ORDER — BENZONATATE 200 MG PO CAPS: 200.0000 mg | ORAL_CAPSULE | Freq: Three times a day (TID) | ORAL | 0 refills | Status: AC

## 2023-02-08 MED ORDER — DOXYCYCLINE HYCLATE 100 MG PO TABS: 100.0000 mg | ORAL_TABLET | Freq: Two times a day (BID) | ORAL | 0 refills | Status: AC

## 2023-02-08 MED ORDER — ALBUTEROL SULFATE (2.5 MG/3ML) 0.083% IN NEBU
2.5000 mg | INHALATION_SOLUTION | Freq: Once | RESPIRATORY_TRACT | Status: AC
  Administered 2023-02-08: 2.5 mg via RESPIRATORY_TRACT
  Filled 2023-02-08: qty 3

## 2023-02-08 MED ORDER — SODIUM CHLORIDE 0.9 % IV SOLN
1.0000 g | Freq: Once | INTRAVENOUS | Status: AC
  Administered 2023-02-08: 1 g via INTRAVENOUS
  Filled 2023-02-08: qty 10

## 2023-02-08 NOTE — Plan of Care (Signed)

## 2023-02-08 NOTE — Progress Notes (Signed)
Reviewed AVS with patient and patient's friend Gavin Pound, including new medications, pharmacy pickup location, and that patient should expect a call from hospice to setup time.  All questions answered. IV removed, dressing clean, dry and intact.  Patient has all belongings.  Discharged home with hospice via private vehicle.  Patient alert and oriented x 4 on room air.

## 2023-02-08 NOTE — Discharge Summary (Signed)
Physician Discharge Summary   Patient: Elizabeth Cantu MRN: [id number] DOB: [birth year]  Admit date:     02/04/2023  Discharge date: 02/08/23  Discharge Physician: Jacquelin Hawking, MD   PCP: Ollen Bowl, MD   Recommendations at discharge:  Home with hospice  Discharge Diagnoses: Principal Problem:   Pleural effusion Active Problems:   Insulin dependent type 2 diabetes mellitus (HCC)   Chronic pain   Metabolic acidosis   AKI (acute kidney injury) (HCC)   HTN (hypertension)   Lung mass   Liver lesion  Resolved Problems:   * No resolved hospital problems. [facility] Course: Elizabeth Cantu is a 87 y.o. female with a history of diabetes mellitus type 2, retinopathy, chronic back pain.  Patient presented secondary to shortness of breath and was found to have evidence of a left-sided loculated pleural effusion in addition to concern for pneumonia.  Imaging also concerning for possible primary lung cancer with liver metastasis.  Patient started empirically on antibiotics.  Thoracentesis ordered for fluid analysis and results are consistent with adenocarcinoma with concern for lung primary. Oncology consulted and decision made to transition to home with hospice.  Assessment and Plan:  Left-sided pleural effusion Associated shortness of breath. Noted on chest imaging.  Concern this is related to pneumonia versus probable underlying cancer.  Thoracentesis performed on 10/9 and cell count is significant for red fluid with high LDH for consistent with exudative effusion.  Cultures with no growth but cytology confirms adenocarcinoma with concern for primary lund disease. Oncology consulted and decision made to pursue home with hospice care. Patient set up with pulmonology as an outpatient for pleural effusion; if effusion re-accumulates, may benefit from a Pleurx catheter.   Acute respiratory failure with hypoxia Secondary to pleural effusion, possible pneumonia and likely cancer. PT  recommending home health services. Weaned to room air prior to discharge   Cough Tussionex, Tessalon.   Possible pneumonia Left lung consolidation. Patient started empirically on Ceftriaxone and doxycycline. Completed Ceftriaxone and discharged to complete doxycycline.   Primary hypertension -Continue Diltiazem   Adenocarcinoma Probable lung cancer Probable metastasis to liver Noted on CT chest imaging.  Discussed with pulmonology who will set up an appointment as an outpatient for patient.  Thoracentesis is ordered with cytology ordered as well and confirms adenocarcinoma. Oncology consulted and patient transitioned to home with hospice.   AKI on CKD stage IIIb No baseline creatinine available. Per PCP notes, patient has a history of CKD stage III. Creatinine of 1.69 on admission, improved to 1.27.   Metabolic acidosis Mild.   Chronic pain Continue gabapentin   Insulin dependent diabetes mellitus type 2 Controlled with hemoglobin A1C of 7.6%. Patient is on Lantus and Ozempic as an outpatient. Started on SSI while inpatient. Continue outpatient regimen.   Diabetic retinopathy Noted.   Hyperlipidemia Continue Lipitor   Consultants: Oncology, Pulmonology Procedures performed: Thoracentesis  Disposition: Hospice care at home Diet recommendation: Regular diet   DISCHARGE MEDICATION: Allergies as of 02/08/2023       Reactions   Penicillins         Medication List     STOP taking these medications    diltiazem 300 MG 24 hr capsule Commonly known as: CARDIZEM CD   Tradjenta 5 MG Tabs tablet Generic drug: linagliptin       TAKE these medications    acetaminophen 500 MG tablet Commonly known as: TYLENOL Take 500 mg by mouth every 6 (six) hours as needed.   albuterol  108 (90 Base) MCG/ACT inhaler Commonly known as: VENTOLIN HFA Inhale 1 puff into the lungs every 4 (four) hours as needed for shortness of breath.   aspirin 81 MG tablet Take 81 mg by  mouth daily.   atorvastatin 20 MG tablet Commonly known as: LIPITOR Take 20 mg by mouth daily.   benzonatate 200 MG capsule Commonly known as: TESSALON Take 1 capsule (200 mg total) by mouth 3 (three) times daily.   calcium carbonate 600 MG Tabs tablet Commonly known as: OS-CAL Take 600 mg by mouth daily.   chlorpheniramine-HYDROcodone 10-8 MG/5ML Commonly known as: TUSSIONEX Take 5 mLs by mouth every 12 (twelve) hours as needed for up to 7 days for cough.   diltiazem 300 MG 24 hr capsule Commonly known as: TIAZAC Take 300 mg by mouth daily.   doxycycline 100 MG tablet Commonly known as: VIBRA-TABS Take 1 tablet (100 mg total) by mouth 2 (two) times daily for 1 day.   ferrous sulfate 325 (65 FE) MG tablet Take 325 mg by mouth daily with breakfast.   gabapentin 300 MG capsule Commonly known as: NEURONTIN Take 1 capsule (300 mg total) by mouth 3 (three) times daily.   lamoTRIgine 25 MG tablet Commonly known as: LAMICTAL For the first 5 days take 1 pill a day. Then, for the next 5 days take 1 pill twice a day.  Then increase to 2 pills twice a day thereafter   Lantus SoloStar 100 UNIT/ML Solostar Pen Generic drug: insulin glargine Inject 14 Units into the skin at bedtime.   losartan 100 MG tablet Commonly known as: COZAAR Take 100 mg by mouth daily.   Ozempic (2 MG/DOSE) 8 MG/3ML Sopn Generic drug: Semaglutide (2 MG/DOSE) Inject 2 mg into the skin once a week.   Pain Relieving Crea Apply topically daily.   pantoprazole 40 MG tablet Commonly known as: PROTONIX Take 40 mg by mouth daily.   SYSTANE FREE OP Apply 1 drop to eye in the morning and at bedtime.   VITAMIN B-12 PO Take 2,500 mcg by mouth daily.        Follow-up Information     [facility] Follow up.   Why: [facility] Contact information: [address] [place] [address] [phone]                Discharge  Exam: BP (!) 136/53 (BP Location: Left Arm)   Pulse 63   Temp 97.9 F (36.6 C) (Oral)   Resp 15   Ht 5\' 3"  (1.6 m)   Wt 68.4 kg   SpO2 (!) 83%   BMI 26.71 kg/m   General exam: Appears calm and comfortable Respiratory system: Wheezing bilaterally. Respiratory effort normal. Cardiovascular system: S1 & S2 heard, RRR. No murmurs, rubs, gallops or clicks. Gastrointestinal system: Abdomen is nondistended, soft and nontender. Normal bowel sounds heard. Central nervous system: Alert and oriented. No focal neurological deficits. Musculoskeletal: No edema. No calf tenderness Skin: No cyanosis. No rashes Psychiatry: Judgement and insight appear normal. Mood & affect appropriate.   Condition at discharge: Home with hospice  The results of significant diagnostics from this hospitalization (including imaging, microbiology, ancillary and laboratory) are listed below for reference.   Imaging Studies: DG Chest 1 View  Result Date: 02/05/2023 CLINICAL DATA:  Thoracentesis. EXAM: CHEST  1 VIEW COMPARISON:  02/04/2023 and CT chest 02/04/2023. FINDINGS: Patient is slightly rotated. Trachea is midline. Thoracic aorta  is calcified. Heart size is grossly stable. Septal thickening and airspace consolidation in the left lung with a large loculated left pleural effusion, slightly decreased in yesterday. Minimal right basilar atelectasis. IMPRESSION: Slight decrease in size of a large loculated left pleural effusion with septal thickening and airspace consolidation in the left lung, findings indicative of malignancy. Electronically Signed   By: Leanna Battles M.D.   On: 02/05/2023 13:59   US THORACENTESIS ASP PLEURAL SPACE W/IMG GUIDE  Result Date: 02/05/2023 INDICATION: Patient with history of prior tobacco use, dyspnea, cough; left hilar soft tissue mass, liver lesions and adenopathy along with left pleural effusion noted on recent imaging. Request received for diagnostic and therapeutic left thoracentesis.  EXAM: ULTRASOUND GUIDED DIAGNOSTIC AND THERAPEUTIC LEFT THORACENTESIS MEDICATIONS: 8 mL 1% lidocaine COMPLICATIONS: None immediate. PROCEDURE: An ultrasound guided thoracentesis was thoroughly discussed with the patient and questions answered. The benefits, risks, alternatives and complications were also discussed. The patient understands and wishes to proceed with the procedure. Written consent was obtained. Ultrasound was performed to localize and mark an adequate pocket of fluid in the left chest. The area was then prepped and draped in the normal sterile fashion. 1% Lidocaine was used for local anesthesia. Under ultrasound guidance a 6 Fr Safe-T-Centesis catheter was introduced. Thoracentesis was performed. The catheter was removed and a dressing applied. FINDINGS: A total of approximately 940 cc of dark, bloody fluid was removed. Samples were sent to the laboratory as requested by the clinical team. Due to patient coughing and chest discomfort only the above amount of fluid was removed today. IMPRESSION: Successful ultrasound guided diagnostic and therapeutic left thoracentesis yielding 940 cc of pleural fluid. Performed by: Artemio Aly Electronically Signed   By: Richarda Overlie M.D.   On: 02/05/2023 12:37   CT Chest Wo Contrast  Result Date: 02/04/2023 CLINICAL DATA:  Pleural effusion. Malignancy suspected. Shortness of breath. EXAM: CT CHEST WITHOUT CONTRAST TECHNIQUE: Multidetector CT imaging of the chest was performed following the standard protocol without IV contrast. RADIATION DOSE REDUCTION: This exam was performed according to the departmental dose-optimization program which includes automated exposure control, adjustment of the mA and/or kV according to patient size and/or use of iterative reconstruction technique. COMPARISON:  Chest radiograph dated 02/04/2023. FINDINGS: Evaluation of this exam is limited in the absence of intravenous contrast. Cardiovascular: There is no cardiomegaly or  pericardial effusion. Moderate atherosclerotic calcification of the thoracic aorta. No aneurysmal dilatation. The central pulmonary arteries are grossly unremarkable. Mediastinum/Nodes: Paratracheal adenopathy measures 12 mm in short axis. Anterior mediastinal adenopathy measures 11 mm short axis in the prevascular space. Evaluation of the left hilum is very limited due to consolidative changes of the left lung. The esophagus is grossly unremarkable. Lungs/Pleura: Left hilar soft tissue encasing the left mainstem bronchus branches. There is high-grade narrowing of the left upper and left lower lobe bronchi. There is a moderate size left pleural effusion. There is consolidative changes of the majority of the left lung which may represent compressive atelectasis or pneumonia/mass. 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 Background of mild centrilobular emphysema. No pneumothorax. There is apparent nodularity of the pleura along the left hemidiaphragm suspicious for pleural implants. Upper Abdomen: Multiple hepatic hypodense lesions consistent with metastatic disease and measure up to 6 cm in the right lobe of the liver. Musculoskeletal: Osteopenia.  No acute osseous pathology. IMPRESSION: 1. Left hilar soft tissue encasing the left mainstem bronchus branches with high-grade narrowing of  the left upper and left lower lobe bronchi. Findings are concerning for primary lung malignancy. 2. Moderate size left pleural effusion with consolidative changes of the majority of the left lung which may represent compressive atelectasis or pneumonia/mass. 3. 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 4. Mediastinal adenopathy. 5. Multiple hepatic metastatic lesions. 6. Aortic Atherosclerosis (ICD10-I70.0) and Emphysema (ICD10-J43.9). Electronically Signed   By: Elgie Collard  M.D.   On: 02/04/2023 19:15   DG Chest 2 View  Result Date: 02/04/2023 CLINICAL DATA:  Shortness of breath and left pleural effusion by chest x-ray. EXAM: CHEST - 2 VIEW COMPARISON:  01/29/2023 FINDINGS: Worsening loculated left pleural fluid and ill-defined perihilar opacity in the left lung as well as volume loss of the left lung. Although this may represent progressive pneumonia with associated parapneumonic effusion or empyema, underlying malignancy is not excluded and correlation with CT of the chest is recommended. The right lung appears clear and normally aerated. No visible bony abnormalities. IMPRESSION: Worsening loculated left pleural fluid and ill-defined perihilar opacity in the left lung. Although this may represent progressive pneumonia with associated parapneumonic effusion or empyema, underlying malignancy is not excluded and correlation with CT of the chest is recommended. Electronically Signed   By: Irish Lack M.D.   On: 02/04/2023 17:52   DG Chest 2 View  Result Date: 02/04/2023 CLINICAL DATA:  Acute cough. EXAM: CHEST - 2 VIEW COMPARISON:  10/09/2008 FINDINGS: New densities at the left lung base and along the periphery of the left chest. Findings are suggestive for at least a moderate sized pleural effusion which may be loculated. Patchy parenchymal densities throughout the left lung could be associated with atelectasis and/or infection. Right lung is relatively clear. Cardiac silhouette is obscured by the left chest densities. IMPRESSION: 1. Parenchymal densities throughout the left lung with left pleural effusion. Left pleural effusion is likely loculated based on the distribution. Findings are concerning for infection. Recommend close follow-up or further characterization with chest CT. 2. These results will be called to the ordering clinician or representative by the Radiologist Assistant, and communication documented in the PACS or [employer]. Electronically Signed   By:  Richarda Overlie M.D.   On: 02/04/2023 14:38    Microbiology: Results for orders placed or performed during the hospital encounter of 02/04/23  Blood Culture (routine x 2)     Status: None (Preliminary result)   Collection Time: 02/04/23  5:05 PM   Specimen: BLOOD RIGHT ARM  Result Value Ref Range Status   Specimen Description   Final    BLOOD RIGHT ARM Performed at [facility], [address] [place], [place] [address]    Special Requests   Final    BOTTLES DRAWN AEROBIC AND ANAEROBIC Blood Culture adequate volume Performed at [facility], [address], [place], [place] [address]    Culture   Final    NO GROWTH 4 DAYS Performed at [facility], [address], [place], [place] [address]    Report Status PENDING  Incomplete  Blood Culture (routine x 2)     Status: None (Preliminary result)   Collection Time: 02/04/23  5:08 PM   Specimen: BLOOD LEFT ARM  Result Value Ref Range Status   Specimen Description   Final    BLOOD LEFT ARM Performed at [facility], [address] [place], [place] [address]    Special Requests   Final  BOTTLES DRAWN AEROBIC AND ANAEROBIC Blood Culture results may not be optimal due to an excessive volume of blood received in culture bottles Performed at [facility], [address], [place], [place] [address]    Culture   Final    NO GROWTH 4 DAYS Performed at [facility], [address] [place], [place] [address]    Report Status PENDING  Incomplete  Body fluid culture w Gram Stain     Status: None (Preliminary result)   Collection Time: 02/05/23 11:52 AM   Specimen: A: Pleural, Left; Pleural Fluid   B: Pleural, Left; Body Fluid  Result Value Ref Range Status   Specimen Description   Final    PLEURAL LT Performed at [facility], [address], [place], [place] [address]    Special Requests   Final    Normal Performed at [facility], [address] [place], [place] [address]    Gram Stain NO WBC SEEN NO ORGANISMS SEEN   Final   Culture   Final    NO GROWTH 3 DAYS Performed at [facility], [address], [place], [place] [address]    Report Status PENDING  Incomplete    Labs: CBC: Recent Labs  Lab 02/04/23 1623 02/05/23 0357 02/07/23 0848  WBC 10.3 9.8 8.5  NEUTROABS 7.4  --   --   HGB 14.4 12.5 11.7*  HCT 44.8 39.4 36.8  MCV 90.7 92.1 91.3  PLT 410* 315 286   Basic Metabolic Panel: Recent Labs  Lab 02/04/23 1623 02/05/23 0357 02/07/23 0848  NA 139 137 140  K 3.6 3.6 3.8  CL 101 106 106  CO2 21* 20* 24  GLUCOSE 154* 122* 143*  BUN 26* 24* 18  CREATININE 1.69* 1.52* 1.27*  CALCIUM 9.0 8.3* 8.4*   Liver Function Tests: Recent Labs  Lab 02/04/23 1623 02/05/23 1458  AST 42*  --   ALT 27  --   ALKPHOS 87  --   BILITOT 0.8  --   PROT 8.2* 7.2  ALBUMIN 3.3*  --    CBG: Recent Labs  Lab 02/07/23 1326 02/07/23 1631 02/07/23 2205 02/08/23 0748 02/08/23 1135  GLUCAP 122* 186* 93 121* 170*    Discharge time spent: 35 minutes.  Signed: Jacquelin Hawking, MD [facility] 02/08/2023

## 2023-02-08 NOTE — Progress Notes (Signed)
Pulse oximetry on room air is 91-93% ambulating.

## 2023-02-08 NOTE — Discharge Instructions (Signed)
Elizabeth Cantu,  You were in the hospital with shortness of breath. This appears to be related to a cancer that caused fluid to build up around your lungs. You have elected for hospice. Please follow-up with the lung doctor as you might benefit from a catheter to the space around your lung if you have recurrent issues with fluid buildup.

## 2023-02-08 NOTE — Consult Note (Signed)
[name] ADMISSION NOTE  Patient Care Team: Ollen Bowl, MD as PCP - General (Internal Medicine)   ASSESSMENT & PLAN:  87 year old female with history of cigarette smoking, diabetes, hypertension presenting with new diagnosis of metastatic lung cancer.  CT scan showed  large left hilar soft tissue mass encasing the left main bronchus.  Moderate left pleural effusion.  Multiple liver lesions.  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  We discussed clinical presentation is consistent with stage IV lung cancer.  She has multiple liver metastases and some larger in sizes.  We discussed this is in general not curable but treatable.  She expressed her wishes as knowing her husband did not do well with chemotherapy, she does not wish to go through this.  She is fairly weak at baseline and reporting lying in bed mostly.  She wished to be comfortable and we discussed hospice.  She states she understands what hospice is and would like to have hospice at home.  This is very reasonable.  Recommend hospice consult.  Thank you for the consult, will sign off.  Please call if any questions.  All questions were answered.    Melven Sartorius, MD 02/08/2023 12:50 PM   CHIEF COMPLAINTS/PURPOSE OF ADMISSION Lung cancer  HISTORY OF PRESENTING ILLNESS:  Elizabeth Cantu 87 y.o. female is admitted for respiratory failure.  Patient reported subacute onset of decreased appetite, weight loss, worsening shortness of breath, coughing and weakness over the last few months.  She attributes her decreased appetite to her DM medicine.  She presents on 10/8 with shortness of breath and ongoing coughing.  CT scan showed large left hilar soft tissue mass encasing the left main bronchus.  Moderate left pleural effusion.  Multiple liver lesions.  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  At baseline, she reported fairly weak, she  is lying in bed most of the time.  She expressed that she is not interested in chemotherapy knowing her husband went through and did not go well.  Rest of review of system as below.  Summary of oncologic history as follows: Oncology History   No history exists.    MEDICAL HISTORY:  Past Medical History:  Diagnosis Date   Diabetes mellitus without complication (HCC)    Hypertension     SURGICAL HISTORY: Past Surgical History:  Procedure Laterality Date   ABDOMINAL HYSTERECTOMY     CATARACT EXTRACTION Bilateral    ORIF FOOT FRACTURE Right     SOCIAL HISTORY: Social History   Socioeconomic History   Marital status: Widowed    Spouse name: Not on file   Number of children: Not on file   Years of education: Not on file   Highest education level: Not on file  Occupational History   Not on file  Tobacco Use   Smoking status: Never   Smokeless tobacco: Current  Substance and Sexual Activity   Alcohol use: No   Drug use: No   Sexual activity: Not on file  Other Topics Concern   Not on file  Social History Narrative   Not on file   Social Determinants of Health   Financial Resource Strain: Not on file  Food Insecurity: No Food Insecurity (02/04/2023)   Hunger Vital Sign    Worried About Running Out of Food in the Last Year: Never true    Ran Out of Food in the Last Year: Never true  Transportation Needs: No Transportation Needs (02/04/2023)  PRAPARE - Administrator, Civil Service (Medical): No    Lack of Transportation (Non-Medical): No  Physical Activity: Not on file  Stress: Not on file  Social Connections: Not on file  Intimate Partner Violence: Not At Risk (02/04/2023)   Humiliation, Afraid, Rape, and Kick questionnaire    Fear of Current or Ex-Partner: No    Emotionally Abused: No    Physically Abused: No    Sexually Abused: No    FAMILY HISTORY: Family History  Problem Relation Age of Onset   Diabetes Mother     ALLERGIES:  is allergic to  penicillins.  MEDICATIONS:  Current Facility-Administered Medications  Medication Dose Route Frequency Provider Last Rate Last Admin   acetaminophen (TYLENOL) tablet 650 mg  650 mg Oral Q6H PRN Narda Bonds, MD   650 mg at 02/06/23 0133   albuterol (VENTOLIN HFA) 108 (90 Base) MCG/ACT inhaler 2 puff  2 puff Inhalation Q2H PRN Terald Sleeper, MD       aspirin EC tablet 81 mg  81 mg Oral Daily Tu, Ching T, DO   81 mg at 02/08/23 0817   atorvastatin (LIPITOR) tablet 20 mg  20 mg Oral Daily Tu, Ching T, DO   20 mg at 02/08/23 1610   benzonatate (TESSALON) capsule 200 mg  200 mg Oral TID Narda Bonds, MD   200 mg at 02/08/23 0817   calcium carbonate (OS-CAL - dosed in mg of elemental calcium) tablet 1,250 mg  1,250 mg Oral Q breakfast Tu, Ching T, DO   1,250 mg at 02/08/23 0818   cefTRIAXone (ROCEPHIN) 1 g in sodium chloride 0.9 % 100 mL IVPB  1 g Intravenous Q24H Tu, Ching T, DO   Stopped at 02/07/23 1751   chlorpheniramine-HYDROcodone (TUSSIONEX) 10-8 MG/5ML suspension 5 mL  5 mL Oral Q12H Narda Bonds, MD   5 mL at 02/08/23 0816   diltiazem (CARDIZEM CD) 24 hr capsule 300 mg  300 mg Oral Daily Tu, Ching T, DO   300 mg at 02/08/23 0815   doxycycline (VIBRA-TABS) tablet 100 mg  100 mg Oral Q12H Tu, Ching T, DO   100 mg at 02/08/23 0553   ferrous sulfate tablet 325 mg  325 mg Oral Q breakfast Tu, Ching T, DO   325 mg at 02/08/23 0816   gabapentin (NEURONTIN) capsule 300 mg  300 mg Oral TID Tu, Ching T, DO   300 mg at 02/08/23 0817   guaiFENesin-dextromethorphan (ROBITUSSIN DM) 100-10 MG/5ML syrup 5 mL  5 mL Oral Q4H PRN Narda Bonds, MD   5 mL at 02/08/23 0816   insulin aspart (novoLOG) injection 0-9 Units  0-9 Units Subcutaneous TID PC & HS Tu, Ching T, DO   2 Units at 02/08/23 1139   lamoTRIgine (LAMICTAL) tablet 50 mg  50 mg Oral BID Tu, Ching T, DO   50 mg at 02/08/23 0818   losartan (COZAAR) tablet 100 mg  100 mg Oral Daily Tu, Ching T, DO   100 mg at 02/08/23 0815   Oral care  mouth rinse  15 mL Mouth Rinse PRN Narda Bonds, MD       pantoprazole (PROTONIX) EC tablet 40 mg  40 mg Oral Daily Tu, Ching T, DO   40 mg at 02/08/23 9604    REVIEW OF SYSTEMS:   Constitutional: Denies fevers, positive for weight loss  Negative for abnormal night sweats Eyes: Denies visual change Ears, nose, mouth, throat, and face:  Denies sore throat  Respiratory: Positive for cough, shortness of breath.  No hemoptysis Cardiovascular: Denies palpitation, chest discomfort or chest pain Gastrointestinal:  Denies nausea, vomiting, diarrhea, constipation, or abdominal pain Lymphatics: Denies  mass Neurological: Denies numbness, tingling or new weaknesses All other systems were reviewed with the patient and are negative.  PHYSICAL EXAMINATION: ECOG PERFORMANCE STATUS: 3 - Symptomatic, >50% confined to bed  Vitals:   02/08/23 0514 02/08/23 0800  BP: 126/65   Pulse: 84   Resp:    Temp: 97.8 F (36.6 C)   SpO2: 100% 97%   Filed Weights   02/04/23 2005  Weight: 150 lb 12.7 oz (68.4 kg)    GENERAL:alert, no distress and comfortable SKIN: skin color normal. No jaundice EYES: normal, sclera clear OROPHARYNX: no exudate, moist NECK: supple. No mass LYMPH:  no palpable cervical lymphadenopathy LUNGS: clear to auscultation on the right.  No breath sounds on the left.   HEART: regular rate & rhythm and no murmurs ABDOMEN:abdomen soft, non-tender and nondistended Musculoskeletal:  no lower extremity edema NEURO: alert & fluent speech; no focal motor/sensory deficits Strength and sensation equal bilaterally  LABORATORY DATA:  I have reviewed the data as listed Lab Results  Component Value Date   WBC 8.5 02/07/2023   HGB 11.7 (L) 02/07/2023   HCT 36.8 02/07/2023   MCV 91.3 02/07/2023   PLT 286 02/07/2023   Recent Labs    02/04/23 1623 02/05/23 0357 02/05/23 1458 02/07/23 0848  NA 139 137  --  140  K 3.6 3.6  --  3.8  CL 101 106  --  106  CO2 21* 20*  --  24   GLUCOSE 154* 122*  --  143*  BUN 26* 24*  --  18  CREATININE 1.69* 1.52*  --  1.27*  CALCIUM 9.0 8.3*  --  8.4*  GFRNONAA 30* 34*  --  42*  PROT 8.2*  --  7.2  --   ALBUMIN 3.3*  --   --   --   AST 42*  --   --   --   ALT 27  --   --   --   ALKPHOS 87  --   --   --   BILITOT 0.8  --   --   --     RADIOGRAPHIC STUDIES: I have personally reviewed the radiological images as listed and agreed with the findings in the report. DG Chest 1 View  Result Date: 02/05/2023 CLINICAL DATA:  Thoracentesis. EXAM: CHEST  1 VIEW COMPARISON:  02/04/2023 and CT chest 02/04/2023. FINDINGS: Patient is slightly rotated. Trachea is midline. Thoracic aorta is calcified. Heart size is grossly stable. Septal thickening and airspace consolidation in the left lung with a large loculated left pleural effusion, slightly decreased in yesterday. Minimal right basilar atelectasis. IMPRESSION: Slight decrease in size of a large loculated left pleural effusion with septal thickening and airspace consolidation in the left lung, findings indicative of malignancy. Electronically Signed   By: Leanna Battles M.D.   On: 02/05/2023 13:59   US THORACENTESIS ASP PLEURAL SPACE W/IMG GUIDE  Result Date: 02/05/2023 INDICATION: Patient with history of prior tobacco use, dyspnea, cough; left hilar soft tissue mass, liver lesions and adenopathy along with left pleural effusion noted on recent imaging. Request received for diagnostic and therapeutic left thoracentesis. EXAM: ULTRASOUND GUIDED DIAGNOSTIC AND THERAPEUTIC LEFT THORACENTESIS MEDICATIONS: 8 mL 1% lidocaine COMPLICATIONS: None immediate. PROCEDURE: An ultrasound guided thoracentesis was thoroughly discussed with the patient and  questions answered. The benefits, risks, alternatives and complications were also discussed. The patient understands and wishes to proceed with the procedure. Written consent was obtained. Ultrasound was performed to localize and mark an adequate pocket of  fluid in the left chest. The area was then prepped and draped in the normal sterile fashion. 1% Lidocaine was used for local anesthesia. Under ultrasound guidance a 6 Fr Safe-T-Centesis catheter was introduced. Thoracentesis was performed. The catheter was removed and a dressing applied. FINDINGS: A total of approximately 940 cc of dark, bloody fluid was removed. Samples were sent to the laboratory as requested by the clinical team. Due to patient coughing and chest discomfort only the above amount of fluid was removed today. IMPRESSION: Successful ultrasound guided diagnostic and therapeutic left thoracentesis yielding 940 cc of pleural fluid. Performed by: Artemio Aly Electronically Signed   By: Richarda Overlie M.D.   On: 02/05/2023 12:37   CT Chest Wo Contrast  Result Date: 02/04/2023 CLINICAL DATA:  Pleural effusion. Malignancy suspected. Shortness of breath. EXAM: CT CHEST WITHOUT CONTRAST TECHNIQUE: Multidetector CT imaging of the chest was performed following the standard protocol without IV contrast. RADIATION DOSE REDUCTION: This exam was performed according to the departmental dose-optimization program which includes automated exposure control, adjustment of the mA and/or kV according to patient size and/or use of iterative reconstruction technique. COMPARISON:  Chest radiograph dated 02/04/2023. FINDINGS: Evaluation of this exam is limited in the absence of intravenous contrast. Cardiovascular: There is no cardiomegaly or pericardial effusion. Moderate atherosclerotic calcification of the thoracic aorta. No aneurysmal dilatation. The central pulmonary arteries are grossly unremarkable. Mediastinum/Nodes: Paratracheal adenopathy measures 12 mm in short axis. Anterior mediastinal adenopathy measures 11 mm short axis in the prevascular space. Evaluation of the left hilum is very limited due to consolidative changes of the left lung. The esophagus is grossly unremarkable. Lungs/Pleura: Left hilar soft  tissue encasing the left mainstem bronchus branches. There is high-grade narrowing of the left upper and left lower lobe bronchi. There is a moderate size left pleural effusion. There is consolidative changes of the majority of the left lung which may represent compressive atelectasis or pneumonia/mass. 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 Background of mild centrilobular emphysema. No pneumothorax. There is apparent nodularity of the pleura along the left hemidiaphragm suspicious for pleural implants. Upper Abdomen: Multiple hepatic hypodense lesions consistent with metastatic disease and measure up to 6 cm in the right lobe of the liver. Musculoskeletal: Osteopenia.  No acute osseous pathology. IMPRESSION: 1. Left hilar soft tissue encasing the left mainstem bronchus branches with high-grade narrowing of the left upper and left lower lobe bronchi. Findings are concerning for primary lung malignancy. 2. Moderate size left pleural effusion with consolidative changes of the majority of the left lung which may represent compressive atelectasis or pneumonia/mass. 3. 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 4. Mediastinal adenopathy. 5. Multiple hepatic metastatic lesions. 6. Aortic Atherosclerosis (ICD10-I70.0) and Emphysema (ICD10-J43.9). Electronically Signed   By: Elgie Collard M.D.   On: 02/04/2023 19:15   DG Chest 2 View  Result Date: 02/04/2023 CLINICAL DATA:  Shortness of breath and left pleural effusion by chest x-ray. EXAM: CHEST - 2 VIEW COMPARISON:  01/29/2023 FINDINGS: Worsening loculated left pleural fluid and ill-defined perihilar opacity in the left lung as well as volume loss of the left lung. Although this may represent progressive pneumonia with associated parapneumonic effusion or empyema,  underlying malignancy is not excluded and  correlation with CT of the chest is recommended. The right lung appears clear and normally aerated. No visible bony abnormalities. IMPRESSION: Worsening loculated left pleural fluid and ill-defined perihilar opacity in the left lung. Although this may represent progressive pneumonia with associated parapneumonic effusion or empyema, underlying malignancy is not excluded and correlation with CT of the chest is recommended. Electronically Signed   By: Irish Lack M.D.   On: 02/04/2023 17:52   DG Chest 2 View  Result Date: 02/04/2023 CLINICAL DATA:  Acute cough. EXAM: CHEST - 2 VIEW COMPARISON:  10/09/2008 FINDINGS: New densities at the left lung base and along the periphery of the left chest. Findings are suggestive for at least a moderate sized pleural effusion which may be loculated. Patchy parenchymal densities throughout the left lung could be associated with atelectasis and/or infection. Right lung is relatively clear. Cardiac silhouette is obscured by the left chest densities. IMPRESSION: 1. Parenchymal densities throughout the left lung with left pleural effusion. Left pleural effusion is likely loculated based on the distribution. Findings are concerning for infection. Recommend close follow-up or further characterization with chest CT. 2. These results will be called to the ordering clinician or representative by the Radiologist Assistant, and communication documented in the PACS or [employer]. Electronically Signed   By: Richarda Overlie M.D.   On: 02/04/2023 14:38

## 2023-02-08 NOTE — TOC Transition Note (Signed)
Transition of Care [facility]) - CM/SW Discharge Note   Patient Details  Name: Elizabeth Cantu MRN: [id number] Date of Birth: Jun 01, 1935  [facility] CM/SW Contact:  Darleene Cleaver, LCSW Phone Number: 02/08/2023, 3:23 PM   Clinical Narrative:     CSW received referral for patient wanting to go home with hospice.  CSW spoke to patient to discuss hospice agencies and to confirm her decision.  CSW explained that if she chooses to go home with hospice she will not receive HH PT.    Patient expressed understanding, and did not have a preference for hospice agency.  CSW contacted Misty at [facility], and gave the referral.  Per Misty she can accept patient for home hospice services.  CSW confirmed address and phone number with patient.  CSW updated Amy at [facility] that patient has decided to go home with hospice services.      Final next level of care: Home w Hospice Care Barriers to Discharge: Barriers Resolved   Patient Goals and CMS Choice CMS Medicare.gov Compare Post Acute Care list provided to:: Patient Choice offered to / list presented to : Patient  Discharge Placement  Patient will be going home with hospice services through [facility].                       Discharge Plan and Services Additional resources added to the After Visit Summary for                              [facility] Agency: Other - See comment ([facility]) Date [facility] Agency Contacted: 02/08/23 Time HH Agency Contacted: 1323 Representative spoke with at [facility] Agency: Misty  Social Determinants of Health (SDOH) Interventions SDOH Screenings   Food Insecurity: No Food Insecurity (02/04/2023)  Housing: Low Risk  (02/04/2023)  Transportation Needs: No Transportation Needs (02/04/2023)  Utilities: Not At Risk (02/04/2023)  Tobacco Use: High Risk (02/04/2023)     Readmission Risk Interventions    02/05/2023   10:59 AM  Readmission Risk Prevention Plan  Post Dischage Appt Complete   Medication Screening Complete  Transportation Screening Complete

## 2023-02-09 LAB — CULTURE, BLOOD (ROUTINE X 2)
Culture: NO GROWTH
Culture: NO GROWTH
Special Requests: ADEQUATE

## 2023-02-28 DEATH — deceased

## 2023-03-05 ENCOUNTER — Inpatient Hospital Stay: Payer: BC Managed Care – PPO | Admitting: Emergency Medicine
